# Patient Record
Sex: Male | Born: 1980
Health system: Southern US, Community
[De-identification: ages and names within clinical notes are randomized; demographics above are authoritative.]

## PROBLEM LIST (undated history)

## (undated) DIAGNOSIS — M1990 Unspecified osteoarthritis, unspecified site: Secondary | ICD-10-CM

## (undated) DIAGNOSIS — G709 Myoneural disorder, unspecified: Secondary | ICD-10-CM

## (undated) HISTORY — PX: EYE SURGERY: SHX253

## (undated) HISTORY — DX: Myoneural disorder, unspecified: G70.9

## (undated) HISTORY — DX: Unspecified osteoarthritis, unspecified site: M19.90

---

## 2008-06-12 ENCOUNTER — Encounter: Payer: Self-pay | Admitting: Family Medicine

## 2008-06-12 LAB — CONVERTED CEMR LAB: Vit D, 1,25-Dihydroxy: 21 — ABNORMAL LOW (ref 30–89)

## 2009-01-04 ENCOUNTER — Encounter: Payer: Self-pay | Admitting: Family Medicine

## 2012-05-10 ENCOUNTER — Telehealth: Payer: Self-pay | Admitting: Family Medicine

## 2012-05-10 MED ORDER — ERYTHROMYCIN 5 MG/GM OP OINT: TOPICAL_OINTMENT | Freq: Four times a day (QID) | OPHTHALMIC | Status: DC

## 2012-05-10 NOTE — Telephone Encounter (Signed)
Courtesy visit.  Wears contacts.  New pair this AM.  No eye trauma but now with L eye pain/irritation/redness.  Vision at baseline.  Has glasses to wear.    No meds, NKDA  nad ncat PERRL, R conjunctiva wnl L conjunctiva diffusely injected No FB seen.  Pain relieved with tetracaine x1 drop Stained and checked with wood's lamp, 2 very small areas seen with abnormal uptake- 1 a 6 oclock at the limbus and 1 slightly inferior and medial.    Tolerated well  AP: corneal abrasion.  Not high risk.  Vision 20/20 B.  Ery ointment and f/u prn.  Avoid contacts for at least 10 days.

## 2013-09-26 ENCOUNTER — Telehealth: Payer: Self-pay | Admitting: Family Medicine

## 2013-09-26 MED ORDER — CIPROFLOXACIN HCL 500 MG PO TABS: 500.0000 mg | ORAL_TABLET | Freq: Two times a day (BID) | ORAL | Status: DC

## 2013-09-26 MED ORDER — ACETAZOLAMIDE 250 MG PO TABS
250.0000 mg | ORAL_TABLET | Freq: Two times a day (BID) | ORAL | Status: DC
Start: 2013-09-26 — End: 2015-03-25

## 2013-09-26 NOTE — Telephone Encounter (Signed)
D/w [place].  Reasonable for  cipro 500mg  bid x 3 days, 2 courses; and acetazolamide 250mg  bid x 10 days.  Sent.

## 2014-11-23 ENCOUNTER — Telehealth: Payer: Self-pay | Admitting: Family Medicine

## 2014-11-23 MED ORDER — PROPRANOLOL HCL 10 MG PO TABS: 10.0000 mg | ORAL_TABLET | Freq: Every day | ORAL | Status: DC | PRN

## 2014-11-23 NOTE — Telephone Encounter (Signed)
Discussion with patient.  Episodic situational tremor, ie at some public functions.  No tremor or sx o/w.   Feels well o/w.   138/84, HR 78  Reasonable to try low dose of prn BB.  rx sent.

## 2014-12-03 ENCOUNTER — Other Ambulatory Visit: Payer: Self-pay | Admitting: Family Medicine

## 2014-12-03 MED ORDER — AMOXICILLIN 875 MG PO TABS: 875.0000 mg | ORAL_TABLET | Freq: Two times a day (BID) | ORAL | Status: AC

## 2014-12-03 NOTE — Progress Notes (Signed)
ST.  RST positive.  Sent rx for amoxil to [facility].  Pt agrees.

## 2015-03-25 ENCOUNTER — Telehealth: Payer: Self-pay | Admitting: Family Medicine

## 2015-03-25 MED ORDER — AMOXICILLIN 875 MG PO TABS
875.0000 mg | ORAL_TABLET | Freq: Two times a day (BID) | ORAL | Status: AC
Start: 2015-03-25 — End: 2015-04-04

## 2015-03-25 NOTE — Telephone Encounter (Signed)
Patient reports ST, absence of cough, and feeling feverish.  RST pos at clinic.   rx sent.

## 2015-06-19 DIAGNOSIS — H52223 Regular astigmatism, bilateral: Secondary | ICD-10-CM | POA: Diagnosis not present

## 2015-06-19 DIAGNOSIS — H5213 Myopia, bilateral: Secondary | ICD-10-CM | POA: Diagnosis not present

## 2017-12-05 ENCOUNTER — Encounter: Payer: Self-pay | Admitting: Nurse Practitioner

## 2017-12-05 ENCOUNTER — Ambulatory Visit (INDEPENDENT_AMBULATORY_CARE_PROVIDER_SITE_OTHER): Payer: Self-pay | Admitting: Nurse Practitioner

## 2017-12-05 VITALS — BP 130/82 | HR 92 | Temp 98.2°F | Ht 75.0 in | Wt 193.2 lb

## 2017-12-05 DIAGNOSIS — Z0000 Encounter for general adult medical examination without abnormal findings: Secondary | ICD-10-CM

## 2017-12-05 NOTE — Patient Instructions (Signed)
Health Maintenance, Male A healthy lifestyle and preventive care is important for your health and wellness. Ask your health care provider about what schedule of regular examinations is right for you. What should I know about weight and diet? Eat a Healthy Diet  Eat plenty of vegetables, fruits, whole grains, low-fat dairy products, and lean protein.  Do not eat a lot of foods high in solid fats, added sugars, or salt.  Maintain a Healthy Weight Regular exercise can help you achieve or maintain a healthy weight. You should:  Do at least 150 minutes of exercise each week. The exercise should increase your heart rate and make you sweat (moderate-intensity exercise).  Do strength-training exercises at least twice a week.  Watch Your Levels of Cholesterol and Blood Lipids  Have your blood tested for lipids and cholesterol every 5 years starting at 37 years of age. If you are at high risk for heart disease, you should start having your blood tested when you are 37 years old. You may need to have your cholesterol levels checked more often if: ? Your lipid or cholesterol levels are high. ? You are older than 37 years of age. ? You are at high risk for heart disease.  What should I know about cancer screening? Many types of cancers can be detected early and may often be prevented. Lung Cancer  You should be screened every year for lung cancer if: ? You are a current smoker who has smoked for at least 30 years. ? You are a former smoker who has quit within the past 15 years.  Talk to your health care provider about your screening options, when you should start screening, and how often you should be screened.  Colorectal Cancer  Routine colorectal cancer screening usually begins at 37 years of age and should be repeated every 5-10 years until you are 37 years old. You may need to be screened more often if early forms of precancerous polyps or small growths are found. Your health care provider  may recommend screening at an earlier age if you have risk factors for colon cancer.  Your health care provider may recommend using home test kits to check for hidden blood in the stool.  A small camera at the end of a tube can be used to examine your colon (sigmoidoscopy or colonoscopy). This checks for the earliest forms of colorectal cancer.  Prostate and Testicular Cancer  Depending on your age and overall health, your health care provider may do certain tests to screen for prostate and testicular cancer.  Talk to your health care provider about any symptoms or concerns you have about testicular or prostate cancer.  Skin Cancer  Check your skin from head to toe regularly.  Tell your health care provider about any new moles or changes in moles, especially if: ? There is a change in a mole's size, shape, or color. ? You have a mole that is larger than a pencil eraser.  Always use sunscreen. Apply sunscreen liberally and repeat throughout the day.  Protect yourself by wearing long sleeves, pants, a wide-brimmed hat, and sunglasses when outside.  What should I know about heart disease, diabetes, and high blood pressure?  If you are 89-13 years of age, have your blood pressure checked every 3-5 years. If you are 70 years of age or older, have your blood pressure checked every year. You should have your blood pressure measured twice-once when you are at a hospital or clinic, and once when  you are not at a hospital or clinic. Record the average of the two measurements. To check your blood pressure when you are not at a hospital or clinic, you can use: ? An automated blood pressure machine at a pharmacy. ? A home blood pressure monitor.  Talk to your health care provider about your target blood pressure.  If you are between 35-28 years old, ask your health care provider if you should take aspirin to prevent heart disease.  Have regular diabetes screenings by checking your fasting blood  sugar level. ? If you are at a normal weight and have a low risk for diabetes, have this test once every three years after the age of 64. ? If you are overweight and have a high risk for diabetes, consider being tested at a younger age or more often.  A one-time screening for abdominal aortic aneurysm (AAA) by ultrasound is recommended for men aged 71-75 years who are current or former smokers. What should I know about preventing infection? Hepatitis B If you have a higher risk for hepatitis B, you should be screened for this virus. Talk with your health care provider to find out if you are at risk for hepatitis B infection. Hepatitis C Blood testing is recommended for:  Everyone born from [birth year] through 1965.  Anyone with known risk factors for hepatitis C.  Sexually Transmitted Diseases (STDs)  You should be screened each year for STDs including gonorrhea and chlamydia if: ? You are sexually active and are younger than 37 years of age. ? You are older than 37 years of age and your health care provider tells you that you are at risk for this type of infection. ? Your sexual activity has changed since you were last screened and you are at an increased risk for chlamydia or gonorrhea. Ask your health care provider if you are at risk.  Talk with your health care provider about whether you are at high risk of being infected with HIV. Your health care provider may recommend a prescription medicine to help prevent HIV infection.  What else can I do?  Schedule regular health, dental, and eye exams.  Stay current with your vaccines (immunizations).  Do not use any tobacco products, such as cigarettes, chewing tobacco, and e-cigarettes. If you need help quitting, ask your health care provider.  Limit alcohol intake to no more than 2 drinks per day. One drink equals 12 ounces of beer, 5 ounces of wine, or 1 ounces of hard liquor.  Do not use street drugs.  Do not share needles.  Ask your  health care provider for help if you need support or information about quitting drugs.  Tell your health care provider if you often feel depressed.  Tell your health care provider if you have ever been abused or do not feel safe at home. This information is not intended to replace advice given to you by your health care provider. Make sure you discuss any questions you have with your health care provider. Document Released: 11/28/2007 Document Revised: 01/29/2016 Document Reviewed: 03/05/2015 Elsevier Interactive Patient Education  2018 [place] 18-39 Years, Male Preventive care refers to lifestyle choices and visits with your health care provider that can promote health and wellness. What does preventive care include?  A yearly physical exam. This is also called an annual well check.  Dental exams once or twice a year.  Routine eye exams. Ask your health care provider how often you should have  your eyes checked.  Personal lifestyle choices, including: ? Daily care of your teeth and gums. ? Regular physical activity. ? Eating a healthy diet. ? Avoiding tobacco and drug use. ? Limiting alcohol use. ? Practicing safe sex. What happens during an annual well check? The services and screenings done by your health care provider during your annual well check will depend on your age, overall health, lifestyle risk factors, and family history of disease. Counseling Your health care provider may ask you questions about your:  Alcohol use.  Tobacco use.  Drug use.  Emotional well-being.  Home and relationship well-being.  Sexual activity.  Eating habits.  Work and work Statistician.  Screening You may have the following tests or measurements:  Height, weight, and BMI.  Blood pressure.  Lipid and cholesterol levels. These may be checked every 5 years starting at age 11.  Diabetes screening. This is done by checking your blood sugar (glucose) after you  have not eaten for a while (fasting).  Skin check.  Hepatitis C blood test.  Hepatitis B blood test.  Sexually transmitted disease (STD) testing.  Discuss your test results, treatment options, and if necessary, the need for more tests with your health care provider. Vaccines Your health care provider may recommend certain vaccines, such as:  Influenza vaccine. This is recommended every year.  Tetanus, diphtheria, and acellular pertussis (Tdap, Td) vaccine. You may need a Td booster every 10 years.  Varicella vaccine. You may need this if you have not been vaccinated.  HPV vaccine. If you are 79 or younger, you may need three doses over 6 months.  Measles, mumps, and rubella (MMR) vaccine. You may need at least one dose of MMR.You may also need a second dose.  Pneumococcal 13-valent conjugate (PCV13) vaccine. You may need this if you have certain conditions and have not been vaccinated.  Pneumococcal polysaccharide (PPSV23) vaccine. You may need one or two doses if you smoke cigarettes or if you have certain conditions.  Meningococcal vaccine. One dose is recommended if you are age 77-21 years and a first-year college student living in a residence hall, or if you have one of several medical conditions. You may also need additional booster doses.  Hepatitis A vaccine. You may need this if you have certain conditions or if you travel or work in places where you may be exposed to hepatitis A.  Hepatitis B vaccine. You may need this if you have certain conditions or if you travel or work in places where you may be exposed to hepatitis B.  Haemophilus influenzae type b (Hib) vaccine. You may need this if you have certain risk factors.  Talk to your health care provider about which screenings and vaccines you need and how often you need them. This information is not intended to replace advice given to you by your health care provider. Make sure you discuss any questions you have with  your health care provider. Document Released: 07/28/2001 Document Revised: 02/19/2016 Document Reviewed: 04/02/2015 Elsevier Interactive Patient Education  2018 [employer].  Preventing Hypertension Hypertension, commonly called high blood pressure, is when the force of blood pumping through the arteries is too strong. Arteries are blood vessels that carry blood from the heart throughout the body. Over time, hypertension can damage the arteries and decrease blood flow to important parts of the body, including the brain, heart, and kidneys. Often, hypertension does not cause symptoms until blood pressure is very high. For this reason, it is important to have  your blood pressure checked on a regular basis. Hypertension can often be prevented with diet and lifestyle changes. If you already have hypertension, you can control it with diet and lifestyle changes, as well as medicine. What nutrition changes can be made? Maintain a healthy diet. This includes:  Eating less salt (sodium). Ask your health care provider how much sodium is safe for you to have. The general recommendation is to consume less than 1 tsp (2,300 mg) of sodium a day. ? Do not add salt to your food. ? Choose low-sodium options when grocery shopping and eating out.  Limiting fats in your diet. You can do this by eating low-fat or fat-free dairy products and by eating less red meat.  Eating more fruits, vegetables, and whole grains. Make a goal to eat: ? 1-2 cups of fresh fruits and vegetables each day. ? 3-4 servings of whole grains each day.  Avoiding foods and beverages that have added sugars.  Eating fish that contain healthy fats (omega-3 fatty acids), such as mackerel or salmon.  If you need help putting together a healthy eating plan, try the DASH diet. This diet is high in fruits, vegetables, and whole grains. It is low in sodium, red meat, and added sugars. DASH stands for Dietary Approaches to Stop Hypertension. What  lifestyle changes can be made?  Lose weight if you are overweight. Losing just 3?5% of your body weight can help prevent or control hypertension. ? For example, if your present weight is 200 lb (91 kg), a loss of 3-5% of your weight means losing 6-10 lb (2.7-4.5 kg). ? Ask your health care provider to help you with a diet and exercise plan to safely lose weight.  Get enough exercise. Do at least 150 minutes of moderate-intensity exercise each week. ? You could do this in short exercise sessions several times a day, or you could do longer exercise sessions a few times a week. For example, you could take a brisk 10-minute walk or bike ride, 3 times a day, for 5 days a week.  Find ways to reduce stress, such as exercising, meditating, listening to music, or taking a yoga class. If you need help reducing stress, ask your health care provider.  Do not smoke. This includes e-cigarettes. Chemicals in tobacco and nicotine products raise your blood pressure each time you smoke. If you need help quitting, ask your health care provider.  Avoid alcohol. If you drink alcohol, limit alcohol intake to no more than 1 drink a day for nonpregnant women and 2 drinks a day for men. One drink equals 12 oz of beer, 5 oz of wine, or 1 oz of hard liquor. Why are these changes important? Diet and lifestyle changes can help you prevent hypertension, and they may make you feel better overall and improve your quality of life. If you have hypertension, making these changes will help you control it and help prevent major complications, such as:  Hardening and narrowing of arteries that supply blood to: ? Your heart. This can cause a heart attack. ? Your brain. This can cause a stroke. ? Your kidneys. This can cause kidney failure.  Stress on your heart muscle, which can cause heart failure.  What can I do to lower my risk?  Work with your health care provider to make a hypertension prevention plan that works for you.  Follow your plan and keep all follow-up visits as told by your health care provider.  Learn how to check your blood  pressure at home. Make sure that you know your personal target blood pressure, as told by your health care provider. How is this treated? In addition to diet and lifestyle changes, your health care provider may recommend medicines to help lower your blood pressure. You may need to try a few different medicines to find what works best for you. You also may need to take more than one medicine. Take over-the-counter and prescription medicines only as told by your health care provider. Where to find support: Your health care provider can help you prevent hypertension and help you keep your blood pressure at a healthy level. Your local hospital or your community may also provide support services and prevention programs. The American Heart Association offers an online support network at: CheapBootlegs.com.cy Where to find more information: Learn more about hypertension from:  National Heart, Lung, and Blood Institute: ElectronicHangman.is  Centers for Disease Control and Prevention: https://ingram.com/  American Academy of Family Physicians: http://familydoctor.org/familydoctor/en/diseases-conditions/high-blood-pressure.printerview.all.html  Learn more about the DASH diet from:  [place], Lung, and [place]: https://www.reyes.com/  Contact a health care provider if:  You think you are having a reaction to medicines you have taken.  You have recurrent headaches or feel dizzy.  You have swelling in your ankles.  You have trouble with your vision. Summary  Hypertension often does not cause any symptoms until blood pressure is very high. It is important to get your blood pressure checked regularly.  Diet and lifestyle changes are the most important steps in preventing  hypertension.  By keeping your blood pressure in a healthy range, you can prevent complications like heart attack, heart failure, stroke, and kidney failure.  Work with your health care provider to make a hypertension prevention plan that works for you. This information is not intended to replace advice given to you by your health care provider. Make sure you discuss any questions you have with your health care provider. Document Released: 06/16/2015 Document Revised: 02/10/2016 Document Reviewed: 02/10/2016 Elsevier Interactive Patient Education  [employer].

## 2017-12-05 NOTE — Progress Notes (Signed)
Subjective:  Derrick Williams is a 37 y.o. male who presents for basic physical exam. The patient needs this assessment, as this is a requirement for [place] to keep his insurance premiums low.  Patient denies any current health related concerns today.  The patient denies any past medical history of heart disease, lung disease, kidney disease, liver disease, DM, asthma, HTN or seizures.  The patient does not take any medications on a daily basis, has no allergies to medications.  Immunizations are up to date.   Patient has a surgical history of lasik eye surgery bilaterally in 2017.  The patient is not married and has no children.  The patient has a family history of migraines and HTN on his mother's side and HTN on his father's side.  Both parents are still living.  The patient has 2 brothers, who are health.  The patient denies the use of recreational drugs, does not smoke, but does drink socially.   Immunization History  Administered Date(s) Administered  . Influenza Split 04/06/2012    Social History   Tobacco Use  . Smoking status: Not on file  Substance Use Topics  . Alcohol use: Not on file  . Drug use: Not on file    No Known Allergies  Current Outpatient Medications  Medication Sig Dispense Refill  . propranolol (INDERAL) 10 MG tablet Take 1-2 tablets (10-20 mg total) by mouth daily as needed (for tremor). 60 tablet 1   No current facility-administered medications for this visit.     Review of Systems  Constitutional: Negative.   HENT: Negative.   Eyes: Negative.   Respiratory: Negative.   Cardiovascular: Negative.   Gastrointestinal: Negative.   Genitourinary: Negative.   Musculoskeletal: Negative.   Skin: Negative.   Neurological: Negative.   Endo/Heme/Allergies: Negative.   Psychiatric/Behavioral: Negative.     Objective:  BP 130/82   Pulse 92   Temp 98.2 F (36.8 C)   Ht 6\' 3"  (1.905 m)   Wt 193 lb 3.2 oz (87.6 kg)   SpO2 97%   BMI 24.15 kg/m    General Appearance:  Alert, cooperative, no distress, appears stated age  Head:  Normocephalic, without obvious abnormality, atraumatic  Eyes:  PERRL, conjunctiva/corneas clear, EOM's intact, fundi benign, both eyes  Ears:  Normal TM's and external ear canals, both ears  Nose: Nares normal, septum midline, mucosa normal, no drainage or sinus tenderness  Throat: Lips, mucosa, and tongue normal; teeth and gums normal  Neck: Supple, symmetrical, trachea midline, no adenopathy, thyroid: not enlarged, symmetric, no tenderness/mass/nodules, no carotid bruit or JVD  Back:   Symmetric, no curvature, ROM normal, no CVA tenderness  Lungs:   Clear to auscultation bilaterally, respirations unlabored  Chest Wall:  No tenderness or deformity  Heart:  Regular rate and rhythm, S1, S2 normal, no murmur, rub or gallop  Abdomen:   Soft, non-tender, bowel sounds active all four quadrants,  no masses, no organomegaly  Genitalia:  Deferred  Rectal:  Deferred  Extremities: Extremities normal, atraumatic, no cyanosis or edema  Pulses: 2+ and symmetric  Skin: Skin color, texture, turgor normal, no rashes or lesions  Lymph nodes: Cervical, supraclavicular nodes normal  Neurologic: Normal    Assessment:  basic physical exam    Plan:  Patient education provided. The patient provided a copy of his most recent labs.  All labs were withing normal limits.  Patient instructed he is able to follow up with PCP for any further diagnostic screening test. Discussed patient's  blood pressure with him and his family history of same.  Patient understands ways to prevent and manage hypertension.  The patient was given education on health maintenance and health prevention for his age group.  Patient was also given education on HTN.  The patient verbalizes understanding and had no questions at time of discharge. Patient will follow up with PCP.

## 2018-07-14 DIAGNOSIS — H5211 Myopia, right eye: Secondary | ICD-10-CM | POA: Diagnosis not present

## 2018-07-14 DIAGNOSIS — Z0100 Encounter for examination of eyes and vision without abnormal findings: Secondary | ICD-10-CM | POA: Diagnosis not present

## 2018-08-05 ENCOUNTER — Other Ambulatory Visit: Payer: Self-pay | Admitting: Family Medicine

## 2018-08-05 MED ORDER — BENZONATATE 200 MG PO CAPS: 200.0000 mg | ORAL_CAPSULE | Freq: Three times a day (TID) | ORAL | 1 refills | Status: DC | PRN

## 2018-08-05 MED ORDER — ALBUTEROL SULFATE HFA 108 (90 BASE) MCG/ACT IN AERS: 1.0000 | INHALATION_SPRAY | Freq: Four times a day (QID) | RESPIRATORY_TRACT | 0 refills | Status: DC | PRN

## 2018-08-05 NOTE — Progress Notes (Signed)
D/w pt.  Recent cough w/o fever.  Reasonable to try tessalon and SABA prn.  Update me as needed.  He agrees.  Crawford Givens

## 2018-10-17 ENCOUNTER — Other Ambulatory Visit: Payer: Self-pay

## 2018-10-17 ENCOUNTER — Encounter: Payer: Self-pay | Admitting: Family Medicine

## 2018-10-17 ENCOUNTER — Ambulatory Visit: Payer: 59 | Admitting: Family Medicine

## 2018-10-17 DIAGNOSIS — T148XXA Other injury of unspecified body region, initial encounter: Secondary | ICD-10-CM | POA: Diagnosis not present

## 2018-10-17 NOTE — Progress Notes (Signed)
Glass shattered at home about 2 weeks ago.  Then last night was walking barefoot, possible small piece of glass in R 5th toe.  No pain unless weight bearing.  No trauma o/w.  No FCNAVD.    Meds, vitals, and allergies reviewed.   ROS: Per HPI unless specifically indicated in ROS section   nad R foot with normal inspection except for small defect in the R5th toe on the plantar side.  Initially ttp.  Area cleaned with alcohol and examined under magnification.  Very small piece of glass removed with forceps and 25g needle with relief of pain.  Able to bear weight w/o sx thereafter.  No blood loss.  No complications.  Toe bandaged.  Tolerated well.

## 2018-10-17 NOTE — Assessment & Plan Note (Signed)
Doesn't appear infected.  Glass removed w/o complications.  able to bear weight w/o any pain after removal.  Was superficial.  Dressed with bandaid and neosporin.  Update me as needed.  He agreed.

## 2019-04-12 ENCOUNTER — Other Ambulatory Visit: Payer: Self-pay | Admitting: Primary Care

## 2019-04-12 ENCOUNTER — Telehealth: Payer: Self-pay | Admitting: Primary Care

## 2019-04-12 MED ORDER — ALBUTEROL SULFATE HFA 108 (90 BASE) MCG/ACT IN AERS: 1.0000 | INHALATION_SPRAY | Freq: Four times a day (QID) | RESPIRATORY_TRACT | 0 refills | Status: DC | PRN

## 2019-04-12 NOTE — Telephone Encounter (Signed)
Spoke with patient who is needing a refill of his albuterol inhaler. Patient going out of town today, refill sent to pharmacy.

## 2019-06-06 ENCOUNTER — Other Ambulatory Visit: Payer: Self-pay | Admitting: Primary Care

## 2019-09-02 ENCOUNTER — Other Ambulatory Visit: Payer: Self-pay | Admitting: Family Medicine

## 2019-09-02 MED ORDER — GABAPENTIN 100 MG PO CAPS: 200.0000 mg | ORAL_CAPSULE | Freq: Two times a day (BID) | ORAL | 3 refills | Status: DC

## 2019-09-02 MED ORDER — PREDNISONE 50 MG PO TABS: 50.0000 mg | ORAL_TABLET | Freq: Every day | ORAL | 0 refills | Status: DC

## 2019-09-02 NOTE — Progress Notes (Signed)
Phone note  24 hour of intense pain in neck and upper back. Radicular symptoms down right arm in C7-T1 mild weakness noted. Pain not controlled with ibuprofen or tylenol  Will see patient Monday sent in prednisone and gabapentin for the weekend,

## 2019-09-03 ENCOUNTER — Encounter: Payer: Self-pay | Admitting: Family Medicine

## 2019-09-03 NOTE — Progress Notes (Signed)
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  Started on Friday with severe pain 9 out of 10.  States that the pain seems to be improving but unfortunately having continued radicular symptoms and appears to have some weakness he states.  Seems to be in the right arm.  Difficulty with such things as opening a can.  Has been on prednisone and gabapentin for 1 day with some mild improvement.  Continuing Flexeril which has also been somewhat beneficial.  No true injury.  Patient has some mild increase in stress but nothing that has been too severe.  Physical exam shows that patient does have some positive Spurling's on the right side with radicular symptoms in the C7-C8 distribution.  Patient does have weakness in the C8 distribution.  Patient tricep tendon reflex does appear to be intact but possibly less than the contralateral side.  Numbness noted all the way to the axillary on the ulnar aspect of the arm.  No rashes appreciated.  Assessment and plan: Cervical radiculopathy.  Acute onset.  Prednisone and gabapentin continued.  Once patient is done with the prednisone will switch to ibuprofen.  Continue the gabapentin and may need to increase in dose.  X-rays pending of the neck and thoracic.  Worsening symptoms or no improvement in the next 7 to 10 days MRI would likely be necessary.  Patient is in agreement with the plan.

## 2019-09-04 ENCOUNTER — Ambulatory Visit (INDEPENDENT_AMBULATORY_CARE_PROVIDER_SITE_OTHER)
Admission: RE | Admit: 2019-09-04 | Discharge: 2019-09-04 | Disposition: A | Discharge status: Home / Self Care | Payer: 59 | Source: Ambulatory Visit | Attending: Family Medicine | Admitting: Family Medicine

## 2019-09-04 ENCOUNTER — Other Ambulatory Visit: Payer: Self-pay

## 2019-09-04 DIAGNOSIS — M546 Pain in thoracic spine: Secondary | ICD-10-CM | POA: Diagnosis not present

## 2019-09-04 DIAGNOSIS — M542 Cervicalgia: Secondary | ICD-10-CM

## 2019-09-13 ENCOUNTER — Other Ambulatory Visit: Payer: Self-pay

## 2019-09-13 ENCOUNTER — Ambulatory Visit: Payer: 59 | Admitting: Family Medicine

## 2019-09-13 DIAGNOSIS — M5412 Radiculopathy, cervical region: Secondary | ICD-10-CM | POA: Insufficient documentation

## 2019-09-13 MED ORDER — IBUPROFEN-FAMOTIDINE 800-26.6 MG PO TABS: 1.0000 | ORAL_TABLET | Freq: Three times a day (TID) | ORAL | 0 refills | Status: DC

## 2019-09-13 NOTE — Patient Instructions (Signed)
Good to see you Duexis 3 times a day See me again in 4-6 weeks

## 2019-09-13 NOTE — Progress Notes (Signed)
[place] Phone: [phone] Subjective:   [name], am serving as a scribe for Dr. Hulan Saas. This visit occurred during the SARS-CoV-2 public health emergency.  Safety protocols were in place, including screening questions prior to the visit, additional usage of staff PPE, and extensive cleaning of exam room while observing appropriate contact time as indicated for disinfecting solutions.    I'm seeing this patient by the request  of:  No primary care provider on file.  CC: Neck pain  SEG:[id number]   09/03/2019 Cervical radiculopathy.  Acute onset.  Prednisone and gabapentin continued.  Once patient is done with the prednisone will switch to ibuprofen.  Continue the gabapentin and may need to increase in dose.  X-rays pending of the neck and thoracic.  Worsening symptoms or no improvement in the next 7 to 10 days MRI would likely be necessary.  Patient is in agreement with the plan.  Update 09/13/2019 Derrick Williams is a 39 y.o. male coming in with complaint of neck pain. Patient states that medications are helping to reduce pain but that pain is unchanged since last visit. Continues to have right sided upper extremity radiculopathy and pain in right trapezius.       Social History   Socioeconomic History  . Marital status: Single    Spouse name: Not on file  . Number of children: Not on file  . Years of education: Not on file  . Highest education level: Not on file  Occupational History  . Not on file  Tobacco Use  . Smoking status: Never Smoker  . Smokeless tobacco: Never Used  Substance and Sexual Activity  . Alcohol use: Not on file  . Drug use: Not on file  . Sexual activity: Not on file  Other Topics Concern  . Not on file  Social History Narrative  . Not on file   Social Determinants of Health   Financial Resource Strain:   . Difficulty of Paying Living Expenses:   Food  Insecurity:   . Worried About Charity fundraiser in the Last Year:   . Arboriculturist in the Last Year:   Transportation Needs:   . Film/video editor (Medical):   Marland Kitchen Lack of Transportation (Non-Medical):   Physical Activity:   . Days of Exercise per Week:   . Minutes of Exercise per Session:   Stress:   . Feeling of Stress :   Social Connections:   . Frequency of Communication with Friends and Family:   . Frequency of Social Gatherings with Friends and Family:   . Attends Religious Services:   . Active Member of Clubs or Organizations:   . Attends Archivist Meetings:   Marland Kitchen Marital Status:    No Known Allergies No family history on file.  Current Outpatient Medications (Endocrine & Metabolic):  .  predniSONE (DELTASONE) 50 MG tablet, Take 1 tablet (50 mg total) by mouth daily.  Current Outpatient Medications (Cardiovascular):  .  propranolol (INDERAL) 10 MG tablet, Take 1-2 tablets (10-20 mg total) by mouth daily as needed (for tremor).  Current Outpatient Medications (Respiratory):  .  albuterol (VENTOLIN HFA) 108 (90 Base) MCG/ACT inhaler, Inhale 1-2 puffs into the lungs every 6 (six) hours as needed (for cough).  Current Outpatient Medications (Analgesics):  Marland Kitchen  Ibuprofen-Famotidine 800-26.6 MG TABS, Take 1 tablet by mouth in the morning, at noon, and at bedtime.   Current Outpatient  Medications (Other):  .  gabapentin (NEURONTIN) 100 MG capsule, Take 2 capsules (200 mg total) by mouth 2 (two) times daily.   Reviewed prior external information including notes and imaging from  primary care provider As well as notes that were available from care everywhere and other healthcare systems.  Past medical history, social, surgical and family history all reviewed in electronic medical record.  No pertanent information unless stated regarding to the chief complaint.   Review of Systems:  No headache, visual changes, nausea, vomiting, diarrhea, constipation,  dizziness, abdominal pain, skin rash, fevers, chills, night sweats, weight loss, swollen lymph nodes, body aches, joint swelling, chest pain, shortness of breath, mood changes. POSITIVE muscle aches  Objective  Blood pressure 122/82, pulse 75, height 6\' 3"  (1.905 m), weight 212 lb (96.2 kg), SpO2 98 %.   General: No apparent distress alert and oriented x3 mood and affect normal, dressed appropriately.  HEENT: Pupils equal, extraocular movements intact  Respiratory: Patient's speak in full sentences and does not appear short of breath  Cardiovascular: No lower extremity edema, non tender, no erythema  Neuro: Cranial nerves II through XII are intact, neurovascularly intact in all extremities with 2+ DTRs and 2+ pulses.  Gait normal with good balance and coordination.  MSK:  Non tender with full range of motion and good stability and symmetric strength and tone of shoulders, elbows, wrist, hip, knee and ankles bilaterally.  Neck: Inspection loss of lordosis. No palpable stepoffs. Negative Spurling's maneuver. Mild limitation in range of motion lacking last 5 degrees of extension.  With range of motion testing patient does state continued numbness in the C7, C8 and T1 distribution.  No weakness are noted. Grip strength and sensation normal in bilateral hands Strength good C4 to T1 distribution No sensory change to C4 to T1 Negative Hoffman sign bilaterally Reflexes normal   Impression and Recommendations:     This case required medical decision making of moderate complexity. The above documentation has been reviewed and is accurate and complete , DO       Note: This dictation was prepared with Dragon dictation along with smaller phrase technology. Any transcriptional errors that result from this process are unintentional.

## 2019-09-13 NOTE — Assessment & Plan Note (Signed)
New finding, radiation in the C8-T1 distribution, improvement in strength and DTR intact. Pain is being controlled with gabapentin and responded well to prednisone, rx of duexis given today HEP given  RTC in 4 weeks worsenign symptoms like weakness will need MRI.  Does have numbness.

## 2019-09-14 ENCOUNTER — Encounter: Payer: Self-pay | Admitting: Family Medicine

## 2019-09-19 ENCOUNTER — Other Ambulatory Visit: Payer: Self-pay | Admitting: Family Medicine

## 2019-09-19 ENCOUNTER — Other Ambulatory Visit (HOSPITAL_COMMUNITY): Payer: Self-pay | Admitting: Family Medicine

## 2019-09-19 MED ORDER — GABAPENTIN 300 MG PO CAPS: 300.0000 mg | ORAL_CAPSULE | Freq: Three times a day (TID) | ORAL | 3 refills | Status: DC

## 2019-09-26 ENCOUNTER — Other Ambulatory Visit: Payer: Self-pay

## 2019-09-26 DIAGNOSIS — M542 Cervicalgia: Secondary | ICD-10-CM

## 2019-09-30 ENCOUNTER — Other Ambulatory Visit: Payer: Self-pay

## 2019-09-30 ENCOUNTER — Ambulatory Visit (INDEPENDENT_AMBULATORY_CARE_PROVIDER_SITE_OTHER): Payer: 59

## 2019-09-30 DIAGNOSIS — M542 Cervicalgia: Secondary | ICD-10-CM

## 2019-09-30 DIAGNOSIS — M4802 Spinal stenosis, cervical region: Secondary | ICD-10-CM | POA: Diagnosis not present

## 2019-10-04 ENCOUNTER — Encounter: Payer: Self-pay | Admitting: Family Medicine

## 2019-10-04 ENCOUNTER — Other Ambulatory Visit: Payer: Self-pay

## 2019-10-04 DIAGNOSIS — M5412 Radiculopathy, cervical region: Secondary | ICD-10-CM

## 2019-10-19 ENCOUNTER — Ambulatory Visit: Payer: 59 | Admitting: Physical Therapy

## 2019-10-19 ENCOUNTER — Encounter: Payer: Self-pay | Admitting: Physical Therapy

## 2019-10-19 ENCOUNTER — Other Ambulatory Visit: Payer: Self-pay

## 2019-10-19 DIAGNOSIS — M5412 Radiculopathy, cervical region: Secondary | ICD-10-CM

## 2019-10-19 DIAGNOSIS — M542 Cervicalgia: Secondary | ICD-10-CM | POA: Diagnosis not present

## 2019-10-25 NOTE — Progress Notes (Signed)
[place] Phone: [phone] Subjective:   Derrick Williams, am serving as a scribe for Dr. Hulan Saas. This visit occurred during the SARS-CoV-2 public health emergency.  Safety protocols were in place, including screening questions prior to the visit, additional usage of staff PPE, and extensive cleaning of exam room while observing appropriate contact time as indicated for disinfecting solutions.   I'm seeing this patient by the request  of:  Williams primary care provider on file.  CC: Cervical neck pain follow-up  UJW:[id number]   09/13/2019 New finding, radiation in the C8-T1 distribution, improvement in strength and DTR intact. Pain is being controlled with gabapentin and responded well to prednisone, rx of duexis given today HEP given  RTC in 4 weeks worsenign symptoms like weakness will need MRI.  Does have numbness.   Update 10/26/2019 Derrick Williams is a 39 y.o. male coming in with complaint of neck pain. Patient states that his neck pain has improved. Using Tylenol and Aleve in the mornings for pain relief.  Patient since last exam did have a MRI showing the patient did have a T1-T2 protruding disc causing nerve impingement.  Has been doing physical therapy and is making progress.  Denies any worsening of any symptoms.  Decreasing the amount of medications he is taking regularly.  Still not back at his baseline and still has not played tennis which he does think would make things better.    Williams past medical history on file. Williams past surgical history on file. Social History   Socioeconomic History  . Marital status: Single    Spouse name: Not on file  . Number of children: Not on file  . Years of education: Not on file  . Highest education level: Not on file  Occupational History  . Not on file  Tobacco Use  . Smoking status: Never Smoker  . Smokeless tobacco: Never Used  Substance and Sexual Activity    . Alcohol use: Not on file  . Drug use: Not on file  . Sexual activity: Not on file  Other Topics Concern  . Not on file  Social History Narrative  . Not on file   Social Determinants of Health   Financial Resource Strain:   . Difficulty of Paying Living Expenses:   Food Insecurity:   . Worried About Charity fundraiser in the Last Year:   . Arboriculturist in the Last Year:   Transportation Needs:   . Film/video editor (Medical):   Marland Kitchen Lack of Transportation (Non-Medical):   Physical Activity:   . Days of Exercise per Week:   . Minutes of Exercise per Session:   Stress:   . Feeling of Stress :   Social Connections:   . Frequency of Communication with Friends and Family:   . Frequency of Social Gatherings with Friends and Family:   . Attends Religious Services:   . Active Member of Clubs or Organizations:   . Attends Archivist Meetings:   Marland Kitchen Marital Status:    Williams Known Allergies Williams family history on file.  Current Outpatient Medications (Endocrine & Metabolic):  .  predniSONE (DELTASONE) 50 MG tablet, Take 1 tablet (50 mg total) by mouth daily.  Current Outpatient Medications (Cardiovascular):  .  propranolol (INDERAL) 10 MG tablet, Take 1-2 tablets (10-20 mg total) by mouth daily as needed (for tremor).  Current Outpatient Medications (Respiratory):  .  albuterol (VENTOLIN  HFA) 108 (90 Base) MCG/ACT inhaler, Inhale 1-2 puffs into the lungs every 6 (six) hours as needed (for cough).  Current Outpatient Medications (Analgesics):  Marland Kitchen  Ibuprofen-Famotidine 800-26.6 MG TABS, Take 1 tablet by mouth in the morning, at noon, and at bedtime.   Current Outpatient Medications (Other):  .  gabapentin (NEURONTIN) 300 MG capsule, Take 1 capsule (300 mg total) by mouth 3 (three) times daily.   Reviewed prior external information including notes and imaging from  primary care provider As well as notes that were available from care everywhere and other healthcare  systems.  Past medical history, social, surgical and family history all reviewed in electronic medical record.  Williams pertanent information unless stated regarding to the chief complaint.   Review of Systems:  Williams headache, visual changes, nausea, vomiting, diarrhea, constipation, dizziness, abdominal pain, skin rash, fevers, chills, night sweats, weight loss, swollen lymph nodes, body aches, joint swelling, chest pain, shortness of breath, mood changes. POSITIVE muscle aches  Objective  There were Williams vitals taken for this visit.   General: Williams apparent distress alert and oriented x3 mood and affect normal, dressed appropriately.  HEENT: Pupils equal, extraocular movements intact  Respiratory: Patient's speak in full sentences and does not appear short of breath  Cardiovascular: Williams lower extremity edema, non tender, Williams erythema  Neuro: Cranial nerves II through XII are intact, neurovascularly intact in all extremities with 2+ DTRs and 2+ pulses.  Gait normal with good balance and coordination.  MSK:  Non tender with full range of motion and good stability and symmetric strength and tone of shoulders, elbows, wrist, hip, knee and ankles bilaterally.  Neck exam still has some mild loss of lordosis.  Patient does have some worsening discomfort it appears with some extension.  Patient has 5 out of 5 strength of the upper extremities.  Deep tendon reflexes are intact.   Impression and Recommendations:      The above documentation has been reviewed and is accurate and complete Judi Saa, DO       Note: This dictation was prepared with Dragon dictation along with smaller phrase technology. Any transcriptional errors that result from this process are unintentional.

## 2019-10-26 ENCOUNTER — Other Ambulatory Visit: Payer: Self-pay

## 2019-10-26 ENCOUNTER — Ambulatory Visit: Payer: 59 | Admitting: Family Medicine

## 2019-10-26 ENCOUNTER — Encounter: Payer: Self-pay | Admitting: Family Medicine

## 2019-10-26 DIAGNOSIS — M5412 Radiculopathy, cervical region: Secondary | ICD-10-CM

## 2019-10-26 NOTE — Assessment & Plan Note (Signed)
Patient has made some improvement.  Started on some mild muscle energy.  Continues to take ibuprofen and gabapentin fairly regularly.  Discussed the possibility of decreasing this again.  Patient will continue with the physical therapy if he thinks it is beneficial.  Likely no significant weakness and no radicular symptoms at the moment that is significant but continues to have numbness in the T1 distribution.  Follow-up again in 4 to 8 weeks

## 2019-10-28 ENCOUNTER — Encounter: Payer: Self-pay | Admitting: Physical Therapy

## 2019-10-28 NOTE — Therapy (Signed)
[place] [address] [place], [place], [address] Phone: [phone]   Fax:  [phone]  Physical Therapy Evaluation  Patient Details  Name: Derrick Williams MRN: [id number] Date of Birth: 1981-04-24 Referring Provider (PT): Charlann Boxer   Encounter Date: 10/19/2019  PT End of Session - 10/28/19 1426    Visit Number  1    Number of Visits  12    Date for PT Re-Evaluation  11/30/19    Authorization Type  [facility]    PT Start Time  0539    PT Stop Time  1555    PT Time Calculation (min)  40 min    Activity Tolerance  Patient tolerated treatment well    Behavior During Therapy  [facility] for tasks assessed/performed       History reviewed. No pertinent past medical history.  History reviewed. No pertinent surgical history.  There were no vitals filed for this visit.   Subjective Assessment - 10/28/19 1409    Subjective  Pt states increased pain a few weeks ago, no injury to report, was at work and noticed increased pain in neck and tingling into arm, R hand. Pt is R handed. 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 Pt does report improivng sympotms since onset, and decreasing radicular pain.    Patient Stated Goals  decreased pain    Currently in Pain?  Yes    Pain Score  2     Pain Location  Neck    Pain Orientation  Right;Left    Pain Descriptors / Indicators  Aching    Pain Type  Acute pain    Pain Onset  More than a month ago    Pain Frequency  Intermittent         [facility] PT Assessment - 10/28/19 0001      Assessment   Medical Diagnosis  Neck pain,     Referring Provider (PT)  Charlann Boxer    Prior Therapy  no      Balance Screen   Has the patient fallen in the past 6 months  No      Prior Function   Level of Independence  Independent      Cognition   Overall Cognitive Status  Within Functional Limits for tasks assessed      ROM / Strength   AROM / PROM / Strength  AROM;Strength      AROM   Overall AROM  Comments  shoulder: WNL, mild pain with full flexion    AROM Assessment Site  Cervical    Cervical Flexion  mild limitation    Cervical Extension  mild limitation    Cervical - Right Side Bend  mild limitation    Cervical - Left Side Bend  WFL    Cervical - Right Rotation  mild limitation    Cervical - Left Rotation  mild limiitation      Strength   Overall Strength Comments  Shoulders 4+/5;  R finger flex and ext: 5/5    Strength Assessment Site  Hand    Right/Left hand  Right;Left    Right Hand Grip (lbs)  68    Left Hand Grip (lbs)  65      Palpation   Palpation comment  Tightness and tenderness in R UT, levator, and cervical paraspinals bilateral.       Special Tests   Other special tests  Neg ULTT,  Objective measurements completed on examination: See above findings.      [facility] Adult PT Treatment/Exercise - 10/28/19 0001      Self-Care   Self-Care  Posture    Posture  Discussed optimal seated posture and posture for computer and work.       Exercises   Exercises  Neck      Neck Exercises: Seated   Neck Retraction  15 reps    Other Seated Exercise  scap retract x20;       Neck Exercises: Stretches   Upper Trapezius Stretch  3 reps;30 seconds             PT Education - 10/28/19 1426    Education Details  PT POC, Exam findings, posuture    Person(s) Educated  Patient    Methods  Explanation;Demonstration;Verbal cues;Handout    Comprehension  Verbalized understanding;Returned demonstration;Verbal cues required;Need further instruction       PT Short Term Goals - 10/28/19 1432      PT SHORT TERM GOAL #1   Title  Pt to demo improved pain in cervical region to 3/10    Time  2    Period  Weeks    Status  New    Target Date  11/02/19        PT Long Term Goals - 10/28/19 1438      PT LONG TERM GOAL #1   Title  Pt to report decreased pain to 0-2/10 with activity and work duties, with no radicular pain in R UE.    Time   6    Period  Weeks    Status  New    Target Date  11/30/19      PT LONG TERM GOAL #2   Title  Pt to demo full cervical ROM without pain, to improve ability for ADLS and driving.    Time  6    Period  Weeks    Status  New    Target Date  11/30/19      PT LONG TERM GOAL #3   Title  Pt to be independent wtih final HEP    Time  6    Period  Weeks    Status  New    Target Date  11/30/19      PT LONG TERM GOAL #4   Title  Pt to demo improved strength of postural/scapular muscles to at least 4+/5 to improve posture and pain.    Time  6    Period  Weeks    Status  New    Target Date  11/30/19             Plan - 10/28/19 1429    Clinical Impression Statement  Pt presents with primary complaint of increased pain in neck and radicular pain in R UE. Pt with some improvements of pain since onset. Pt with mild ROM limitations, and mild strength deficits. He also has mild tightness and muscle spasm in cervical and shoulder region.  He will benefit from education on posture as well as HEP. Pt with decreased abilit for full functional activites, due to pain, and will benefit from skilled PT to improve.    Examination-Activity Limitations  Reach Overhead;Bend;Sleep;Lift    Examination-Participation Restrictions  Meal Prep;Cleaning;Community Activity;Driving;Laundry;Yard Work    Stability/Clinical Decision Making  Stable/Uncomplicated    Clinical Decision Making  Low    Rehab Potential  Good    PT Frequency  2x / week    PT  Duration  6 weeks    PT Treatment/Interventions  ADLs/Self Care Home Management;Electrical Stimulation;Cryotherapy;Iontophoresis 4mg /ml Dexamethasone;Moist Heat;Traction;Neuromuscular re-education;Therapeutic exercise;Therapeutic activities;Functional mobility training;Patient/family education;Manual techniques;Vasopneumatic Device;Taping;Dry needling;Passive range of motion;Spinal Manipulations;Joint Manipulations    Consulted and Agree with Plan of Care  Patient        Patient will benefit from skilled therapeutic intervention in order to improve the following deficits and impairments:  Decreased range of motion, Increased muscle spasms, Decreased activity tolerance, Pain, Improper body mechanics, Impaired flexibility, Decreased strength  Visit Diagnosis: Radiculopathy, cervical region  Cervicalgia     Problem List Patient Active Problem List   Diagnosis Date Noted  . Cervical radiculitis 09/13/2019  . Foreign body in skin 10/17/2018    12/17/2018, PT, DPT 2:45 PM  10/28/19    [facility] [address] [place], [place], [place] Phone: [phone]   Fax:  [phone]  Name: Derrick Williams MRN: Eustaquio Boyden Date of Birth: 11-30-1980

## 2019-11-09 ENCOUNTER — Other Ambulatory Visit: Payer: Self-pay

## 2019-11-09 ENCOUNTER — Ambulatory Visit: Payer: 59 | Admitting: Physical Therapy

## 2019-11-09 DIAGNOSIS — M5412 Radiculopathy, cervical region: Secondary | ICD-10-CM | POA: Diagnosis not present

## 2019-11-09 DIAGNOSIS — M542 Cervicalgia: Secondary | ICD-10-CM | POA: Diagnosis not present

## 2019-11-16 DIAGNOSIS — H52223 Regular astigmatism, bilateral: Secondary | ICD-10-CM | POA: Diagnosis not present

## 2019-11-16 DIAGNOSIS — H524 Presbyopia: Secondary | ICD-10-CM | POA: Diagnosis not present

## 2019-11-27 ENCOUNTER — Encounter: Payer: Self-pay | Admitting: Physical Therapy

## 2019-11-27 NOTE — Therapy (Signed)
[facility] [address] [place], [place], [address] Phone: [phone]   Fax:  [phone]  Physical Therapy Treatment/Discharge   Patient Details  Name: Derrick Williams MRN: [id number] Date of Birth: 30-Oct-1980 Referring Provider (PT): Charlann Boxer   Encounter Date: 11/09/2019   PT End of Session - 11/27/19 1535    Visit Number 2    Number of Visits 12    Date for PT Re-Evaluation 11/30/19    Authorization Type [facility]    PT Start Time 1600    PT Stop Time 1640    PT Time Calculation (min) 40 min    Activity Tolerance Patient tolerated treatment well    Behavior During Therapy [facility] for tasks assessed/performed           History reviewed. No pertinent past medical history.  History reviewed. No pertinent surgical history.  There were no vitals filed for this visit.   Subjective Assessment - 11/27/19 1534    Subjective Pt states improving pain. still has mild numbness in finger.    Currently in Pain? No/denies    Pain Score 0-No pain                             [facility] Adult PT Treatment/Exercise - 11/27/19 0001      Neck Exercises: Theraband   Rows Green;20 reps    Shoulder External Rotation Red;20 reps    Horizontal ABduction Red;20 reps      Neck Exercises: Standing   Neck Retraction 10 reps      Neck Exercises: Seated   Neck Retraction 10 reps    Neck Retraction Limitations retraction with extension       Manual Therapy   Manual Therapy Joint mobilization;Soft tissue mobilization;Manual Traction;Passive ROM    Joint Mobilization PA mobs, side glides     Soft tissue mobilization DTM to bil cervical parapinals, SO, UT, SOR    Passive ROM Manual stretching for Bil UT and levator    Manual Traction 15 sec x 10;       Neck Exercises: Stretches   Upper Trapezius Stretch 3 reps;30 seconds    Other Neck Stretches Supine pec stretch 45 sec  x3  with wrist flex/ext for nerve glide     Other Neck  Stretches Standing median and ulnar nerve glides x 15 each on R;                   PT Education - 11/27/19 1535    Education Details HEP reviewed, Posture,    Person(s) Educated Patient    Methods Explanation;Demonstration;Verbal cues;Handout    Comprehension Verbalized understanding;Returned demonstration;Verbal cues required            PT Short Term Goals - 11/27/19 1536      PT SHORT TERM GOAL #1   Title Pt to demo improved pain in cervical region to 3/10    Time 2    Period Weeks    Status Achieved    Target Date 11/02/19             PT Long Term Goals - 11/27/19 1536      PT LONG TERM GOAL #1   Title Pt to report decreased pain to 0-2/10 with activity and work duties, with no radicular pain in R UE.    Time 6    Period Weeks    Status Achieved      PT  LONG TERM GOAL #2   Title Pt to demo full cervical ROM without pain, to improve ability for ADLS and driving.    Time 6    Period Weeks    Status Achieved      PT LONG TERM GOAL #3   Title Pt to be independent wtih final HEP    Time 6    Period Weeks    Status Achieved      PT LONG TERM GOAL #4   Title Pt to demo improved strength of postural/scapular muscles to at least 4+/5 to improve posture and pain.    Baseline * will continue to work on with HEP    Time 6    Period Weeks    Status Partially Met                 Plan - 11/27/19 1537    Clinical Impression Statement Pt with improving symptoms. Reviewed importance of postural exercises and HEP. Discussed traction as option for decreasing radicular symptoms/numbness. Pt states that he is pleased with current status, has not pain, just mild tingling into fingers, and requests hold of PT a this time.    Examination-Activity Limitations Reach Overhead;Bend;Sleep;Lift    Examination-Participation Restrictions Meal Prep;Cleaning;Community Activity;Driving;Laundry;Yard Work    Stability/Clinical Decision Making Stable/Uncomplicated    Rehab  Potential Good    PT Frequency 2x / week    PT Duration 6 weeks    PT Treatment/Interventions ADLs/Self Care Home Management;Electrical Stimulation;Cryotherapy;Iontophoresis 28m/ml Dexamethasone;Moist Heat;Traction;Neuromuscular re-education;Therapeutic exercise;Therapeutic activities;Functional mobility training;Patient/family education;Manual techniques;Vasopneumatic Device;Taping;Dry needling;Passive range of motion;Spinal Manipulations;Joint Manipulations    Consulted and Agree with Plan of Care Patient           Patient will benefit from skilled therapeutic intervention in order to improve the following deficits and impairments:  Decreased range of motion, Increased muscle spasms, Decreased activity tolerance, Pain, Improper body mechanics, Impaired flexibility, Decreased strength  Visit Diagnosis: Radiculopathy, cervical region  Cervicalgia     Problem List Patient Active Problem List   Diagnosis Date Noted  . Cervical radiculitis 09/13/2019  . Foreign body in skin 10/17/2018    LLyndee Hensen PT, DPT 3:45 PM  11/27/19    [place] [address]: [phone]  Fax:  [phone] Name: JGerhard RappaportMRN: [id number] of Birth: 802/05/1981  PHYSICAL THERAPY DISCHARGE SUMMARY  Visits from Start of Care: 2  Plan: Patient agrees to discharge.  Patient goals were met. Patient is being discharged due to being pleased with the current functional level.  ?????     LLyndee Hensen PT, DPT 3:45 PM  11/27/19

## 2020-02-29 DIAGNOSIS — L700 Acne vulgaris: Secondary | ICD-10-CM | POA: Diagnosis not present

## 2020-04-18 DIAGNOSIS — L700 Acne vulgaris: Secondary | ICD-10-CM | POA: Diagnosis not present

## 2020-08-09 MED FILL — GABAPENTIN 300 MG CAPSULE: 300 | 30 days supply | Qty: 90 | Fill #0

## 2020-12-19 DIAGNOSIS — H17821 Peripheral opacity of cornea, right eye: Secondary | ICD-10-CM | POA: Diagnosis not present

## 2020-12-19 DIAGNOSIS — H5211 Myopia, right eye: Secondary | ICD-10-CM | POA: Diagnosis not present

## 2020-12-19 DIAGNOSIS — Z0100 Encounter for examination of eyes and vision without abnormal findings: Secondary | ICD-10-CM | POA: Diagnosis not present

## 2021-01-02 DIAGNOSIS — L700 Acne vulgaris: Secondary | ICD-10-CM | POA: Diagnosis not present

## 2021-01-02 DIAGNOSIS — L110 Acquired keratosis follicularis: Secondary | ICD-10-CM | POA: Diagnosis not present

## 2021-03-26 NOTE — Progress Notes (Signed)
[name] [address] [place] [address] Phone: [phone] Subjective:   Derrick Williams, am serving as a scribe for Dr. Antoine Primas. This visit occurred during the SARS-CoV-2 public health emergency.  Safety protocols were in place, including screening questions prior to the visit, additional usage of staff PPE, and extensive cleaning of exam room while observing appropriate contact time as indicated for disinfecting solutions.   I'm seeing this patient by the request  of:  No primary care provider on file.  CC: episode of foot drop   [id number]  Seen May 2021 for cervical radiculitis.  Derrick Williams is a 40 y.o. male coming in with complaint of neck pain. Pain and numbness in R arm. Same as last visit.    Foot drop R>L on Saturday. Had not run in a while and ran 1 mile. No history of foot pain. No lumbar spine pain recently or leg/hip pain. Patient states started 1-2 hours after running sand lasted 4 hours. Patient was very concerne.d by next morning was completely resolved but here for further evaluation  Patient continues to have neck pain as well. Seen previously for T1-2 protrusion and nerve pain but pain resolved with PT  Continue numbness of forearm       No past medical history on file. No past surgical history on file. Social History   Socioeconomic History   Marital status: Single    Spouse name: Not on file   Number of children: Not on file   Years of education: Not on file   Highest education level: Not on file  Occupational History   Not on file  Tobacco Use   Smoking status: Never   Smokeless tobacco: Never  Substance and Sexual Activity   Alcohol use: Not on file   Drug use: Not on file   Sexual activity: Not on file  Other Topics Concern   Not on file  Social History Narrative   Not on file   Social Determinants of Health   Financial Resource Strain: Not on file  Food Insecurity: Not on file   Transportation Needs: Not on file  Physical Activity: Not on file  Stress: Not on file  Social Connections: Not on file   No Known Allergies No family history on file.  Current Outpatient Medications (Endocrine & Metabolic):    predniSONE (DELTASONE) 20 MG tablet, Take 2 tablets (40 mg total) by mouth daily with breakfast.   predniSONE (DELTASONE) 50 MG tablet, Take 1 tablet (50 mg total) by mouth daily.  Current Outpatient Medications (Cardiovascular):    propranolol (INDERAL) 10 MG tablet, Take 1-2 tablets (10-20 mg total) by mouth daily as needed (for tremor).  Current Outpatient Medications (Respiratory):    albuterol (VENTOLIN HFA) 108 (90 Base) MCG/ACT inhaler, Inhale 1-2 puffs into the lungs every 6 (six) hours as needed (for cough).  Current Outpatient Medications (Analgesics):    Ibuprofen-Famotidine 800-26.6 MG TABS, Take 1 tablet by mouth in the morning, at noon, and at bedtime.   Current Outpatient Medications (Other):    gabapentin (NEURONTIN) 100 MG capsule, Take 2 capsules (200 mg total) by mouth at bedtime.   gabapentin (NEURONTIN) 300 MG capsule, Take 1 capsule (300 mg total) by mouth 3 (three) times daily.   gabapentin (NEURONTIN) 300 MG capsule, TAKE 1 CAPSULE BY MOUTH THREE TIMES DAILY.   Reviewed prior external information including notes and imaging from  primary care provider As well as notes that were available from  care everywhere and other healthcare systems.  Past medical history, social, surgical and family history all reviewed in electronic medical record.  No pertanent information unless stated regarding to the chief complaint.   Review of Systems:  No headache, visual changes, nausea, vomiting, diarrhea, constipation, dizziness, abdominal pain, skin rash, fevers, chills, night sweats, weight loss, swollen lymph nodes, body aches, joint swelling, chest pain, shortness of breath, mood changes. POSITIVE muscle aches  Objective  Blood pressure  118/86, pulse 86, height 6\' 3"  (1.905 m), weight 211 lb (95.7 kg), SpO2 98 %.   General: No apparent distress alert and oriented x3 mood and affect normal, dressed appropriately.  HEENT: Pupils equal, extraocular movements intact  Respiratory: Patient's speak in full sentences and does not appear short of breath  Cardiovascular: No lower extremity edema, non tender, no erythema  Gait normal with good balance and coordination.  MSK:  neck exam mild TTP and with worsening discomfort with extension of the neck. Negative suprlings but numbness noted in the C8-T1 distribution on the right  No pain in the low back. LE strength 5/5 DTR 2+ of the achilles, full ROM of the ankles.    Impression and Recommendations:    The above documentation has been reviewed and is accurate and complete , DO

## 2021-03-27 ENCOUNTER — Ambulatory Visit (INDEPENDENT_AMBULATORY_CARE_PROVIDER_SITE_OTHER): Payer: 59

## 2021-03-27 ENCOUNTER — Other Ambulatory Visit: Payer: Self-pay

## 2021-03-27 ENCOUNTER — Ambulatory Visit (INDEPENDENT_AMBULATORY_CARE_PROVIDER_SITE_OTHER): Payer: 59 | Admitting: Family Medicine

## 2021-03-27 VITALS — BP 118/86 | HR 86 | Ht 75.0 in | Wt 211.0 lb

## 2021-03-27 DIAGNOSIS — M545 Low back pain, unspecified: Secondary | ICD-10-CM

## 2021-03-27 DIAGNOSIS — M21371 Foot drop, right foot: Secondary | ICD-10-CM

## 2021-03-27 DIAGNOSIS — M2550 Pain in unspecified joint: Secondary | ICD-10-CM

## 2021-03-27 DIAGNOSIS — M5412 Radiculopathy, cervical region: Secondary | ICD-10-CM

## 2021-03-27 DIAGNOSIS — M21372 Foot drop, left foot: Secondary | ICD-10-CM

## 2021-03-27 LAB — VITAMIN B12: Vitamin B-12: 491 pg/mL (ref 211–911)

## 2021-03-27 LAB — COMPREHENSIVE METABOLIC PANEL
ALT: 18 U/L (ref 0–53)
AST: 22 U/L (ref 0–37)
Albumin: 4.8 g/dL (ref 3.5–5.2)
Alkaline Phosphatase: 62 U/L (ref 39–117)
BUN: 16 mg/dL (ref 6–23)
CO2: 31 mEq/L (ref 19–32)
Calcium: 9.7 mg/dL (ref 8.4–10.5)
Chloride: 101 mEq/L (ref 96–112)
Creatinine, Ser: 0.7 mg/dL (ref 0.40–1.50)
GFR: 115.53 mL/min (ref >60.00)
Glucose, Bld: 92 mg/dL (ref 70–99)
Potassium: 4 mEq/L (ref 3.5–5.1)
Sodium: 138 mEq/L (ref 135–145)
Total Bilirubin: 0.7 mg/dL (ref 0.2–1.2)
Total Protein: 7.5 g/dL (ref 6.0–8.3)

## 2021-03-27 LAB — CBC WITH DIFFERENTIAL/PLATELET
Basophils Absolute: 0.1 10*3/uL (ref 0.0–0.1)
Basophils Relative: 0.7 % (ref 0.0–3.0)
Eosinophils Absolute: 0.1 10*3/uL (ref 0.0–0.7)
Eosinophils Relative: 1.8 % (ref 0.0–5.0)
HCT: 46.9 % (ref 39.0–52.0)
Hemoglobin: 15.9 g/dL (ref 13.0–17.0)
Lymphocytes Relative: 22.2 % (ref 12.0–46.0)
Lymphs Abs: 1.8 10*3/uL (ref 0.7–4.0)
MCHC: 33.9 g/dL (ref 30.0–36.0)
MCV: 92.5 fl (ref 78.0–100.0)
Monocytes Absolute: 0.7 10*3/uL (ref 0.1–1.0)
Monocytes Relative: 9.4 % (ref 3.0–12.0)
Neutro Abs: 5.3 10*3/uL (ref 1.4–7.7)
Neutrophils Relative %: 65.9 % (ref 43.0–77.0)
Platelets: 202 10*3/uL (ref 150.0–400.0)
RBC: 5.08 Mil/uL (ref 4.22–5.81)
RDW: 13 % (ref 11.5–15.5)
WBC: 8 10*3/uL (ref 4.0–10.5)

## 2021-03-27 LAB — TSH: TSH: 0.72 u[IU]/mL (ref 0.35–5.50)

## 2021-03-27 LAB — IBC PANEL
Iron: 84 ug/dL (ref 42–165)
Saturation Ratios: 21.4 % (ref 20.0–50.0)
TIBC: 392 ug/dL (ref 250.0–450.0)
Transferrin: 280 mg/dL (ref 212.0–360.0)

## 2021-03-27 LAB — VITAMIN D 25 HYDROXY (VIT D DEFICIENCY, FRACTURES): VITD: 21.43 ng/mL — ABNORMAL LOW (ref 30.00–100.00)

## 2021-03-27 LAB — SEDIMENTATION RATE: Sed Rate: 5 mm/hr (ref 0–15)

## 2021-03-27 LAB — T3, FREE: T3, Free: 3.6 pg/mL (ref 2.3–4.2)

## 2021-03-27 LAB — T4, FREE: Free T4: 0.87 ng/dL (ref 0.60–1.60)

## 2021-03-27 LAB — FERRITIN: Ferritin: 78.6 ng/mL (ref 22.0–322.0)

## 2021-03-27 MED ORDER — GABAPENTIN 100 MG PO CAPS: 200.0000 mg | ORAL_CAPSULE | Freq: Every day | ORAL | 0 refills | Status: DC

## 2021-03-27 MED ORDER — PREDNISONE 20 MG PO TABS: 40.0000 mg | ORAL_TABLET | Freq: Every day | ORAL | 0 refills | Status: DC

## 2021-03-27 NOTE — Assessment & Plan Note (Signed)
Patient did have the episode of bilateral foot drop after running.  Patient was able to run initially and was fine until 2 hours afterwards.  Could be secondary to compression with potential sural nerve disruption.  Discussed with patient about monitoring.  Did last approximately 3 hours.  Differential is quite broad and the patient does have the cervical radiculopathy and we will need to monitor.  Patient knows if worsening symptoms we will discuss further.  X-rays of the back as well as laboratory work-up is in process.  Depending on findings we will discuss different treatment options.  If continuing to have difficulty or no findings we may need to consider the possibility of advanced imaging to rule out such things as multiple sclerosis.

## 2021-03-27 NOTE — Assessment & Plan Note (Signed)
Patient has had cervical radiculitis and is having worsening pain again.  This patient previously did have a T1-T2 difficulty.  Patient did respond to the formal physical therapy previously but has never regained the strength.  We discussed restarting the gabapentin and will start at 200 mg.  Patient has had 300 mg previously.  We will see how patient responds.  Given prednisone and will do 40 mg daily for 5 days to clinic, down as well.  With patient also having bilateral foot drop would like to get laboratory work-up as well as a lumbar x-ray to further evaluate why this did occur.  Discussed with him I would like him to try to manage it running 1 to do rule out possible central cord dysfunction this that could be contributing.  Patient denies any headaches, weight loss, no family history of multiple sclerosis.  Patient did not fully have the foot drop resulting hours.  Patient is in agreement with plan and will follow up with me again 4 weeks

## 2021-03-27 NOTE — Patient Instructions (Addendum)
Calf stretches before running Lumbar xray Labs today Gabapentin 200mg  at night Prednisone 40mg  for next 5 days Send message next week to tell me how you are doing Follow up in 4 weeks if needed

## 2021-03-28 LAB — PTH, INTACT AND CALCIUM
Calcium: 9.7 mg/dL (ref 8.6–10.3)
PTH: 19 pg/mL (ref 16–77)

## 2021-04-23 NOTE — Progress Notes (Signed)
[name] [address] [place] [address] Phone: [phone] Subjective:   Derrick Williams, am serving as a scribe for Dr. Antoine Primas. This visit occurred during the SARS-CoV-2 public health emergency.  Safety protocols were in place, including screening questions prior to the visit, additional usage of staff PPE, and extensive cleaning of exam room while observing appropriate contact time as indicated for disinfecting solutions.   CC: Neck pain and right elbow pain  [id number]  03/27/2021 Patient did have the episode of bilateral foot drop after running.  Patient was able to run initially and was fine until 2 hours afterwards.  Could be secondary to compression with potential sural nerve disruption.  Discussed with patient about monitoring.  Did last approximately 3 hours.  Differential is quite broad and the patient does have the cervical radiculopathy and we will need to monitor.  Patient knows if worsening symptoms we will discuss further.  X-rays of the back as well as laboratory work-up is in process.  Depending on findings we will discuss different treatment options.  If continuing to have difficulty or no findings we may need to consider the possibility of advanced imaging to rule out such things as multiple sclerosis.  Patient has had cervical radiculitis and is having worsening pain again.  This patient previously did have a T1-T2 difficulty.  Patient did respond to the formal physical therapy previously but has never regained the strength.  We discussed restarting the gabapentin and will start at 200 mg.  Patient has had 300 mg previously.  We will see how patient responds.  Given prednisone and will do 40 mg daily for 5 days to clinic, down as well.  With patient also having bilateral foot drop would like to get laboratory work-up as well as a lumbar x-ray to further evaluate why this did occur.  Discussed with him I would like him to try to  manage it running 1 to do rule out possible central cord dysfunction this that could be contributing.  Patient denies any headaches, weight loss, no family history of multiple sclerosis.  Patient did not fully have the foot drop resulting hours.  Patient is in agreement with plan and will follow up with me again 4 weeks  Updated 04/24/2021 Derrick Williams is a 40 y.o. male coming in with complaint of LBP. Has not had drop foot since last visit.   Cervical spine pain is unchanged.   Patient having R elbow pain over lateral aspect. Does play tennis. No change in grip recently. Using tennis elbow strap, Voltaren gel and NSAID for pain relief.  Patient continues to have some difficulty more on the right elbow at this moment though.  Does not remember any injury and has not been playing any tennis recently.  Xray IMPRESSION: Straightening of normal lordosis, can be seen with muscle spasm. Otherwise negative radiographs of the lumbar spine.      No past medical history on file. No past surgical history on file. Social History   Socioeconomic History   Marital status: Single    Spouse name: Not on file   Number of children: Not on file   Years of education: Not on file   Highest education level: Not on file  Occupational History   Not on file  Tobacco Use   Smoking status: Never   Smokeless tobacco: Never  Substance and Sexual Activity   Alcohol use: Not on file   Drug use: Not on file  Sexual activity: Not on file  Other Topics Concern   Not on file  Social History Narrative   Not on file   Social Determinants of Health   Financial Resource Strain: Not on file  Food Insecurity: Not on file  Transportation Needs: Not on file  Physical Activity: Not on file  Stress: Not on file  Social Connections: Not on file   No Known Allergies No family history on file.  Current Outpatient Medications (Endocrine & Metabolic):    predniSONE (DELTASONE) 20 MG tablet, Take 2 tablets  (40 mg total) by mouth daily with breakfast.   predniSONE (DELTASONE) 50 MG tablet, Take 1 tablet (50 mg total) by mouth daily.  Current Outpatient Medications (Cardiovascular):    nitroGLYCERIN (NITRO-DUR) 0.2 mg/hr patch, Apply 1/4 of a patch to skin once daily.   propranolol (INDERAL) 10 MG tablet, Take 1-2 tablets (10-20 mg total) by mouth daily as needed (for tremor).  Current Outpatient Medications (Respiratory):    albuterol (VENTOLIN HFA) 108 (90 Base) MCG/ACT inhaler, Inhale 1-2 puffs into the lungs every 6 (six) hours as needed (for cough).  Current Outpatient Medications (Analgesics):    Ibuprofen-Famotidine 800-26.6 MG TABS, Take 1 tablet by mouth in the morning, at noon, and at bedtime.   Current Outpatient Medications (Other):    gabapentin (NEURONTIN) 300 MG capsule, Take 1 capsule (300 mg total) by mouth at bedtime.   Reviewed prior external information including notes and imaging from  primary care provider As well as notes that were available from care everywhere and other healthcare systems.  Past medical history, social, surgical and family history all reviewed in electronic medical record.  No pertanent information unless stated regarding to the chief complaint.   Review of Systems:  No headache, visual changes, nausea, vomiting, diarrhea, constipation, dizziness, abdominal pain, skin rash, fevers, chills, night sweats, weight loss, swollen lymph nodes, body aches, joint swelling, chest pain, shortness of breath, mood changes. POSITIVE muscle aches in the right arm   Objective  Blood pressure 124/84, pulse 85, height 6\' 3"  (1.905 m), weight 208 lb (94.3 kg), SpO2 98 %.   General: No apparent distress alert and oriented x3 mood and affect normal, dressed appropriately.  HEENT: Pupils equal, extraocular movements intact  Respiratory: Patient's speak in full sentences and does not appear short of breath  Cardiovascular: No lower extremity edema, non tender, no  erythema  Gait normal with good balance and coordination.  MSK: Right elbow exam shows the patient does have tenderness to palpation over the lateral epicondylar region.  Mild pain over the radial nerve as well.  Good grip strength noted.  Patient noted does still have some tenderness noted in the paraspinal musculature of the cervical spine. Continued numbness in the T1 distribution.  Limited muscular skeletal ultrasound was performed and interpreted by , M  Limited musculoskeletal ultrasound shows the patient does have hypoechoic changes with a potential partial retraction noted of the common extensor tendon noted.  Neovascularization within the tendon sheath corresponding to a tear within the tendon as well. Impression: Common extensor tendon tear   Impression and Recommendations:     The above documentation has been reviewed and is accurate and complete Antoine Primas, DO

## 2021-04-24 ENCOUNTER — Encounter: Payer: Self-pay | Admitting: Family Medicine

## 2021-04-24 ENCOUNTER — Ambulatory Visit: Payer: 59 | Admitting: Family Medicine

## 2021-04-24 ENCOUNTER — Ambulatory Visit: Payer: Self-pay

## 2021-04-24 ENCOUNTER — Other Ambulatory Visit: Payer: Self-pay

## 2021-04-24 VITALS — BP 124/84 | HR 85 | Ht 75.0 in | Wt 208.0 lb

## 2021-04-24 DIAGNOSIS — M25521 Pain in right elbow: Secondary | ICD-10-CM

## 2021-04-24 DIAGNOSIS — S56519A Strain of other extensor muscle, fascia and tendon at forearm level, unspecified arm, initial encounter: Secondary | ICD-10-CM | POA: Insufficient documentation

## 2021-04-24 DIAGNOSIS — M5412 Radiculopathy, cervical region: Secondary | ICD-10-CM | POA: Diagnosis not present

## 2021-04-24 DIAGNOSIS — M7711 Lateral epicondylitis, right elbow: Secondary | ICD-10-CM | POA: Diagnosis not present

## 2021-04-24 MED ORDER — GABAPENTIN 300 MG PO CAPS: 300.0000 mg | ORAL_CAPSULE | Freq: Every day | ORAL | 0 refills | Status: DC

## 2021-04-24 MED ORDER — NITROGLYCERIN 0.2 MG/HR TD PT24: MEDICATED_PATCH | TRANSDERMAL | 0 refills | Status: DC

## 2021-04-24 NOTE — Assessment & Plan Note (Signed)
Patient is some cervical radiculitis.  Patient will increase his gabapentin to 300 mg.  See how patient responds.  Concerned that certainly could be more muscular in nature as well.  Discussed icing regimen and home exercises.  If worsening symptoms may need to consider another advanced imaging but hopefully not necessary.

## 2021-04-24 NOTE — Patient Instructions (Signed)
Nitroglycerin Protocol   Apply 1/4 nitroglycerin patch to affected area daily.  Change position of patch within the affected area every 24 hours.  You may experience a headache during the first 1-2 weeks of using the patch, these should subside.  If you experience headaches after beginning nitroglycerin patch treatment, you may take your preferred over the counter pain reliever.  Another side effect of the nitroglycerin patch is skin irritation or rash related to patch adhesive.  Please notify our office if you develop more severe headaches or rash, and stop the patch.  Tendon healing with nitroglycerin patch may require 12 to 24 weeks depending on the extent of injury.  Men should not use if taking Viagra, Cialis, or Levitra.   Do not use if you have migraines or rosacea.  Increase gabapentin 300mg  at night See me again in 5-6 weeks

## 2021-04-24 NOTE — Assessment & Plan Note (Signed)
Partial tear noted.  Increasing in Doppler flow noted in neovascularization.  Start on nitroglycerin patches.  Warned of potential side effects.  Increase activity slowly.  Differential includes that this could be secondary to weakness from the cervical radiculopathy and we will monitor as well.  Follow-up again in 3 to 4 weeks

## 2021-05-01 DIAGNOSIS — L700 Acne vulgaris: Secondary | ICD-10-CM | POA: Diagnosis not present

## 2021-05-01 DIAGNOSIS — L648 Other androgenic alopecia: Secondary | ICD-10-CM | POA: Diagnosis not present

## 2021-05-01 DIAGNOSIS — L858 Other specified epidermal thickening: Secondary | ICD-10-CM | POA: Diagnosis not present

## 2021-05-01 DIAGNOSIS — L738 Other specified follicular disorders: Secondary | ICD-10-CM | POA: Diagnosis not present

## 2021-05-28 NOTE — Progress Notes (Signed)
[name] [address] [place] [address] Phone: [phone] Subjective:   [name], am serving as a scribe for Dr. Antoine Primas. This visit occurred during the SARS-CoV-2 public health emergency.  Safety protocols were in place, including screening questions prior to the visit, additional usage of staff PPE, and extensive cleaning of exam room while observing appropriate contact time as indicated for disinfecting solutions.   I'm seeing this patient by the request  of:  Pcp, No  CC: Right elbow pain  [id number]  04/24/2021 Partial tear noted.  Increasing in Doppler flow noted in neovascularization.  Start on nitroglycerin patches.  Warned of potential side effects.  Increase activity slowly.  Differential includes that this could be secondary to weakness from the cervical radiculopathy and we will monitor as well.  Follow-up again in 3 to 4 weeks  Updated 05/29/2021 Derrick Williams is a 40 y.o. male coming in with complaint of elbow pain. Pain remains the same. Feels better when wearing wrist brace. Pain maybe a little worse. Patient does not change anything specifically.  Continues the nitroglycerin with no significant side effects.      No past medical history on file. No past surgical history on file. Social History   Socioeconomic History   Marital status: Single    Spouse name: Not on file   Number of children: Not on file   Years of education: Not on file   Highest education level: Not on file  Occupational History   Not on file  Tobacco Use   Smoking status: Never   Smokeless tobacco: Never  Substance and Sexual Activity   Alcohol use: Not on file   Drug use: Not on file   Sexual activity: Not on file  Other Topics Concern   Not on file  Social History Narrative   Not on file   Social Determinants of Health   Financial Resource Strain: Not on file  Food Insecurity: Not on file  Transportation Needs: Not on  file  Physical Activity: Not on file  Stress: Not on file  Social Connections: Not on file   No Known Allergies No family history on file.  Current Outpatient Medications (Endocrine & Metabolic):    predniSONE (DELTASONE) 20 MG tablet, Take 2 tablets (40 mg total) by mouth daily with breakfast.   predniSONE (DELTASONE) 50 MG tablet, Take 1 tablet (50 mg total) by mouth daily.  Current Outpatient Medications (Cardiovascular):    nitroGLYCERIN (NITRO-DUR) 0.2 mg/hr patch, Apply 1/4 of a patch to skin once daily.   propranolol (INDERAL) 10 MG tablet, Take 1-2 tablets (10-20 mg total) by mouth daily as needed (for tremor).  Current Outpatient Medications (Respiratory):    albuterol (VENTOLIN HFA) 108 (90 Base) MCG/ACT inhaler, Inhale 1-2 puffs into the lungs every 6 (six) hours as needed (for cough).  Current Outpatient Medications (Analgesics):    Ibuprofen-Famotidine 800-26.6 MG TABS, Take 1 tablet by mouth in the morning, at noon, and at bedtime.   Current Outpatient Medications (Other):    gabapentin (NEURONTIN) 300 MG capsule, Take 1 capsule (300 mg total) by mouth at bedtime.   Review of Systems:  No headache, visual changes, nausea, vomiting, diarrhea, constipation, dizziness, abdominal pain, skin rash, fevers, chills, night sweats, weight loss, swollen lymph nodes, body aches, joint swelling, chest pain, shortness of breath, mood changes. POSITIVE muscle aches  Objective  Blood pressure 126/90, pulse 73, height 6\' 3"  (1.905 m), weight 210 lb (95.3 kg), SpO2  99 %.   General: No apparent distress alert and oriented x3 mood and affect normal, dressed appropriately.  HEENT: Pupils equal, extraocular movements intact  Respiratory: Patient's speak in full sentences and does not appear short of breath  Cardiovascular: No lower extremity edema, non tender, no erythema  Gait normal with good balance and coordination.  MSK: Right elbow exam shows the patient does have tenderness to  palpation over the lateral epicondylar region.  Seems to be worse with resisted wrist extension.  Good grip strength noted.  No swelling over the elbow with full range of motion otherwise.  Limited muscular skeletal ultrasound was performed and interpreted by Antoine Primas, M Limited ultrasound shows the patient does have some very mild hypoechoic changes still at the insertion of the common extensor tendon.  Patient does have hyperechoic changes significant that is consistent with more of scar tissue formation.  Neovascularization in this area is noted. Impression: Interval improvement of the previous interstitial tearing with scar tissue formation.   Impression and Recommendations:     The above documentation has been reviewed and is accurate and complete Judi Saa, DO

## 2021-05-29 ENCOUNTER — Encounter: Payer: Self-pay | Admitting: Family Medicine

## 2021-05-29 ENCOUNTER — Other Ambulatory Visit: Payer: Self-pay

## 2021-05-29 ENCOUNTER — Ambulatory Visit: Payer: 59 | Admitting: Family Medicine

## 2021-05-29 ENCOUNTER — Ambulatory Visit: Payer: Self-pay

## 2021-05-29 VITALS — BP 126/90 | HR 73 | Ht 75.0 in | Wt 210.0 lb

## 2021-05-29 DIAGNOSIS — S56519A Strain of other extensor muscle, fascia and tendon at forearm level, unspecified arm, initial encounter: Secondary | ICD-10-CM | POA: Diagnosis not present

## 2021-05-29 NOTE — Assessment & Plan Note (Signed)
On ultrasound today even though patient continues to have the same symptoms I do notice some scar tissue formation.  We discussed the potential for either advanced imaging, possible PRP, or different medications.  At this point patient was to continue with the conservative therapy.  Patient can attempt to play tennis in 3 weeks and see how patient responds.  We discussed other ergonomic changes such as a vertical mouse that may be beneficial as well.  Follow-up again in 6 to 8 weeks.

## 2021-05-29 NOTE — Patient Instructions (Signed)
Scar tissue is healing Ok to try tennis in 3 weeks just take it easy Vertical muscle Thicker grip on tennis racket Tell parents hi See you again in 6 weeks

## 2021-07-10 ENCOUNTER — Ambulatory Visit: Payer: 59 | Admitting: Family Medicine

## 2021-07-23 NOTE — Progress Notes (Signed)
[place] Phone: [phone] Subjective:   [name], am serving as a scribe for Dr. Hulan Saas.This visit occurred during the SARS-CoV-2 public health emergency.  Safety protocols were in place, including screening questions prior to the visit, additional usage of staff PPE, and extensive cleaning of exam room while observing appropriate contact time as indicated for disinfecting solutions.  I'm seeing this patient by the request  of:  Pcp, No  CC: Right elbow pain  [id number]  05/29/2021 On ultrasound today even though patient continues to have the same symptoms I do notice some scar tissue formation.  We discussed the potential for either advanced imaging, possible PRP, or different medications.  At this point patient was to continue with the conservative therapy.  Patient can attempt to play tennis in 3 weeks and see how patient responds.  We discussed other ergonomic changes such as a vertical mouse that may be beneficial as well.  Follow-up again in 6 to 8 weeks.  Updated 07/24/2021 Brad Cuna is a 41 y.o. male coming in with complaint of R elbow pain. Patient states that he is doing better. Notes soreness after tennis and during the day.  Patient states that overall seems to be doing relatively well.       No past medical history on file. No past surgical history on file. Social History   Socioeconomic History   Marital status: Single    Spouse name: Not on file   Number of children: Not on file   Years of education: Not on file   Highest education level: Not on file  Occupational History   Not on file  Tobacco Use   Smoking status: Never   Smokeless tobacco: Never  Substance and Sexual Activity   Alcohol use: Not on file   Drug use: Not on file   Sexual activity: Not on file  Other Topics Concern   Not on file  Social History Narrative   Not on file   Social Determinants of  Health   Financial Resource Strain: Not on file  Food Insecurity: Not on file  Transportation Needs: Not on file  Physical Activity: Not on file  Stress: Not on file  Social Connections: Not on file   No Known Allergies No family history on file.  Current Outpatient Medications (Endocrine & Metabolic):    predniSONE (DELTASONE) 20 MG tablet, Take 2 tablets (40 mg total) by mouth daily with breakfast.   predniSONE (DELTASONE) 50 MG tablet, Take 1 tablet (50 mg total) by mouth daily.  Current Outpatient Medications (Cardiovascular):    nitroGLYCERIN (NITRO-DUR) 0.2 mg/hr patch, Apply 1/4 of a patch to skin once daily.   propranolol (INDERAL) 10 MG tablet, Take 1-2 tablets (10-20 mg total) by mouth daily as needed (for tremor).  Current Outpatient Medications (Respiratory):    albuterol (VENTOLIN HFA) 108 (90 Base) MCG/ACT inhaler, Inhale 1-2 puffs into the lungs every 6 (six) hours as needed (for cough).  Current Outpatient Medications (Analgesics):    Ibuprofen-Famotidine 800-26.6 MG TABS, Take 1 tablet by mouth in the morning, at noon, and at bedtime.   Current Outpatient Medications (Other):    gabapentin (NEURONTIN) 300 MG capsule, Take 1 capsule (300 mg total) by mouth at bedtime.   Objective  Blood pressure 140/82, pulse 85, height 6\' 3"  (1.905 m), weight 217 lb (98.4 kg), SpO2 97 %.   General: No apparent distress alert and oriented x3 mood and  affect normal, dressed appropriately.  HEENT: Pupils equal, extraocular movements intact  Respiratory: Patient's speak in full sentences and does not appear short of breath  Cardiovascular: No lower extremity edema, non tender, no erythema  Gait normal with good balance and coordination.  MSK: Right elbow exam does have some tenderness to palpation but no pain with resistance of extension of the wrist.  Limited muscular skeletal ultrasound was performed and interpreted by Hulan Saas, M  Limited ultrasound noted on the right  side elbow shows the patient still has some hypoechoic changes noted within the tendon sheath.  Patient also has some mild increasing neovascularization.  Does seem to be improvement from previous exam.  No significant avulsion noted. Impression: Interval improvement   Impression and Recommendations:    The above documentation has been reviewed and is accurate and complete Lyndal Pulley, DO

## 2021-07-24 ENCOUNTER — Ambulatory Visit: Payer: 59 | Admitting: Family Medicine

## 2021-07-24 ENCOUNTER — Ambulatory Visit: Payer: Self-pay

## 2021-07-24 ENCOUNTER — Other Ambulatory Visit: Payer: Self-pay

## 2021-07-24 ENCOUNTER — Encounter: Payer: Self-pay | Admitting: Family Medicine

## 2021-07-24 VITALS — BP 140/82 | HR 85 | Ht 75.0 in | Wt 217.0 lb

## 2021-07-24 DIAGNOSIS — M25521 Pain in right elbow: Secondary | ICD-10-CM

## 2021-07-24 DIAGNOSIS — S56519A Strain of other extensor muscle, fascia and tendon at forearm level, unspecified arm, initial encounter: Secondary | ICD-10-CM | POA: Diagnosis not present

## 2021-07-24 NOTE — Assessment & Plan Note (Signed)
Still very mild hypoechoic changes but nothing severe at this time.  No changes in management follow-up as needed if doing relatively well.

## 2021-07-24 NOTE — Patient Instructions (Signed)
Good to see you   

## 2021-10-30 DIAGNOSIS — L02222 Furuncle of back [any part, except buttock]: Secondary | ICD-10-CM | POA: Diagnosis not present

## 2021-12-23 IMAGING — DX DG LUMBAR SPINE 2-3V
3 series · 3 of 3 positions shown · non-contrast
Comparison: None.

CLINICAL DATA: Lumbar spine pain. Patient reports low back pain for
4 days. No known injury.

EXAM:
LUMBAR SPINE - 2-3 VIEW

[l-spine ap]
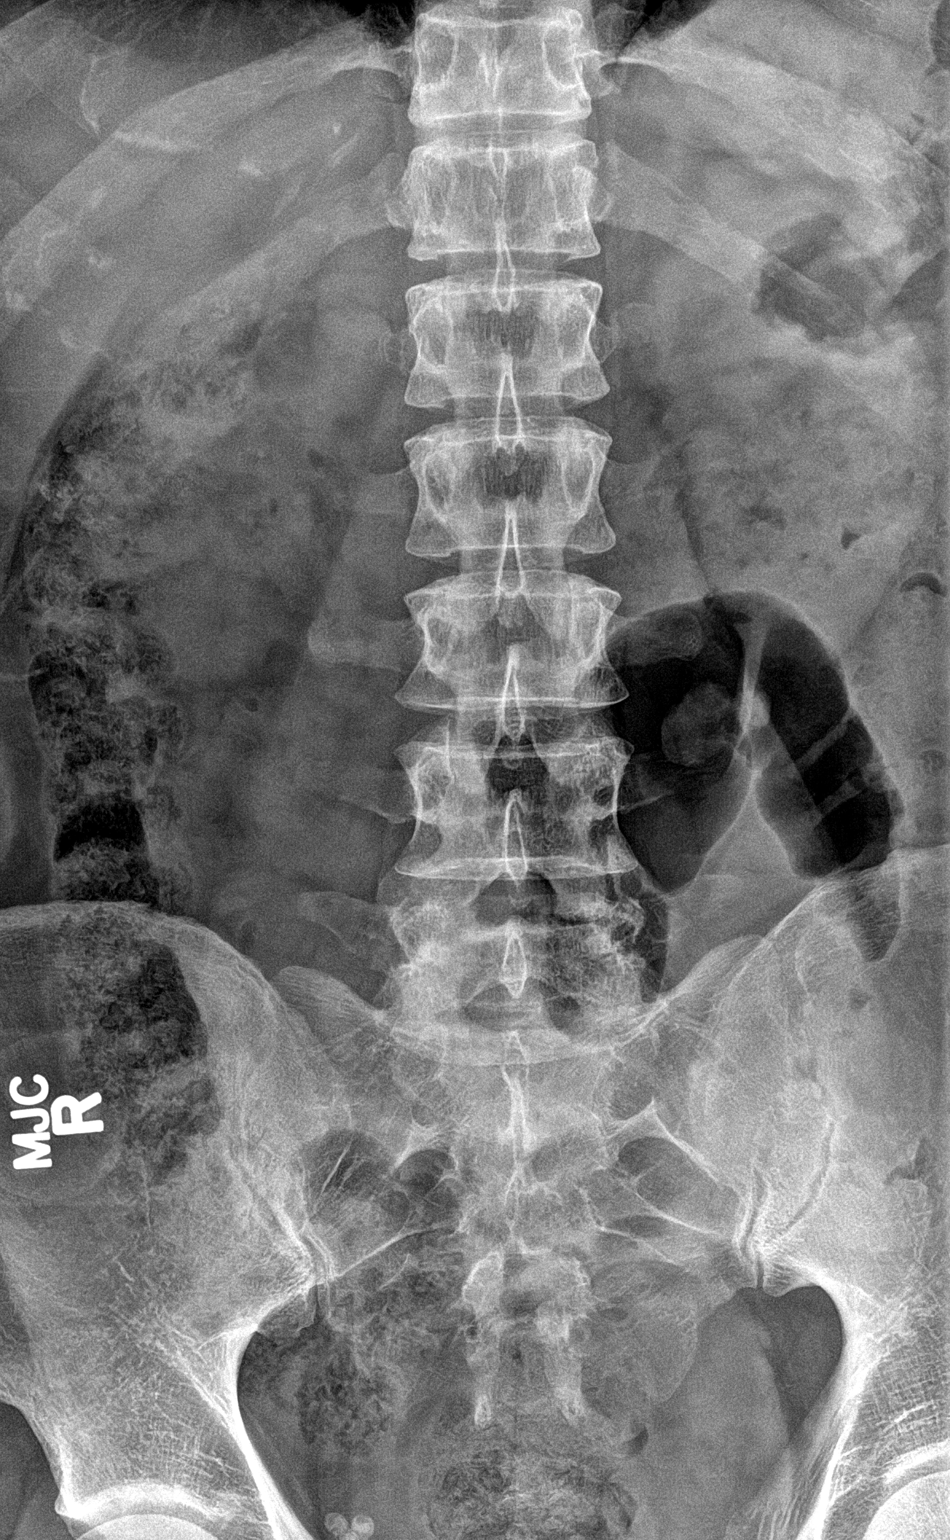

[l-spine lateral]
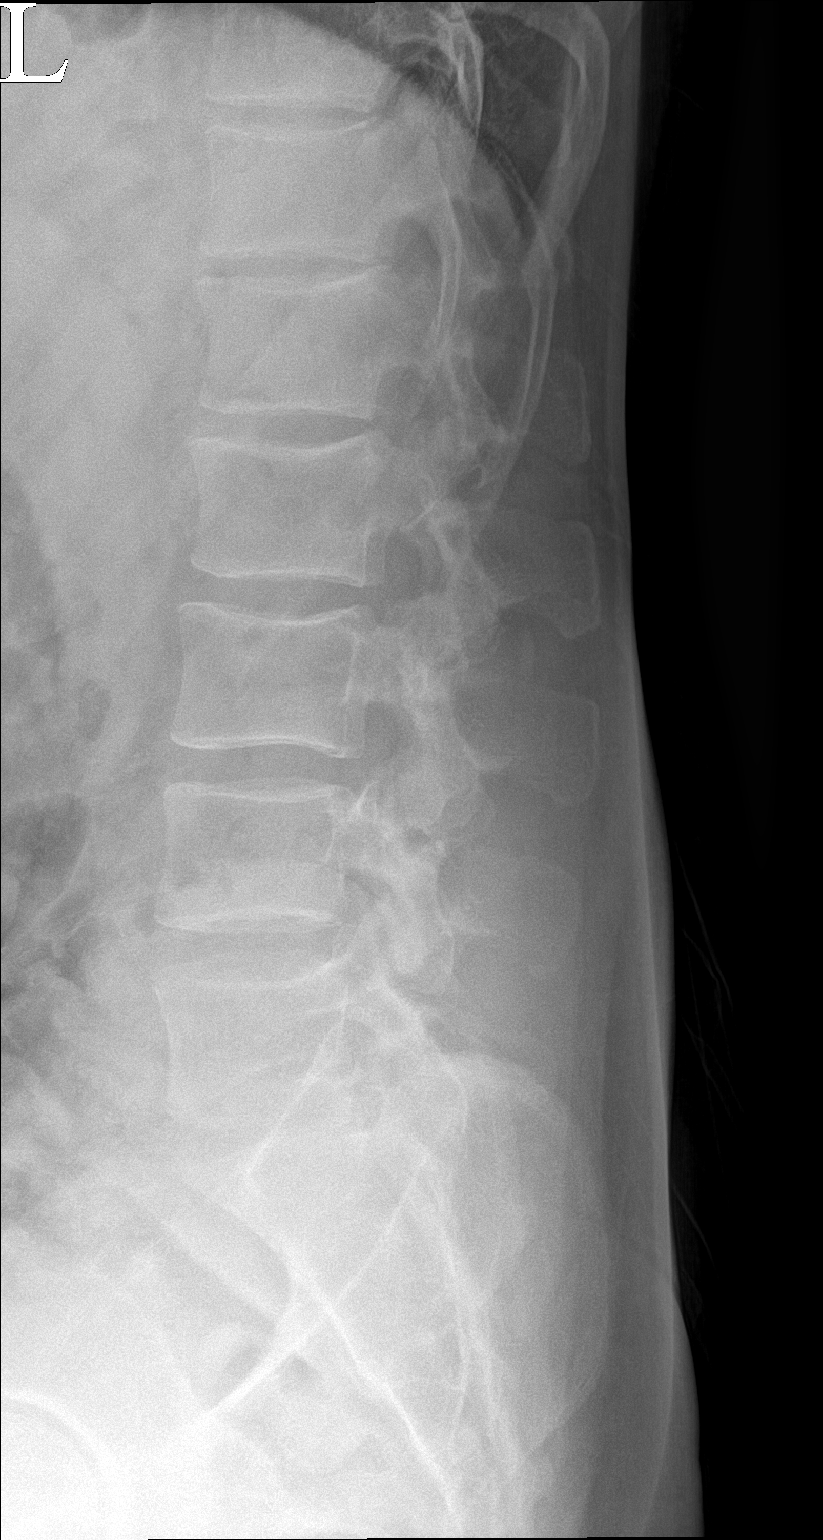

[l-spine spot]
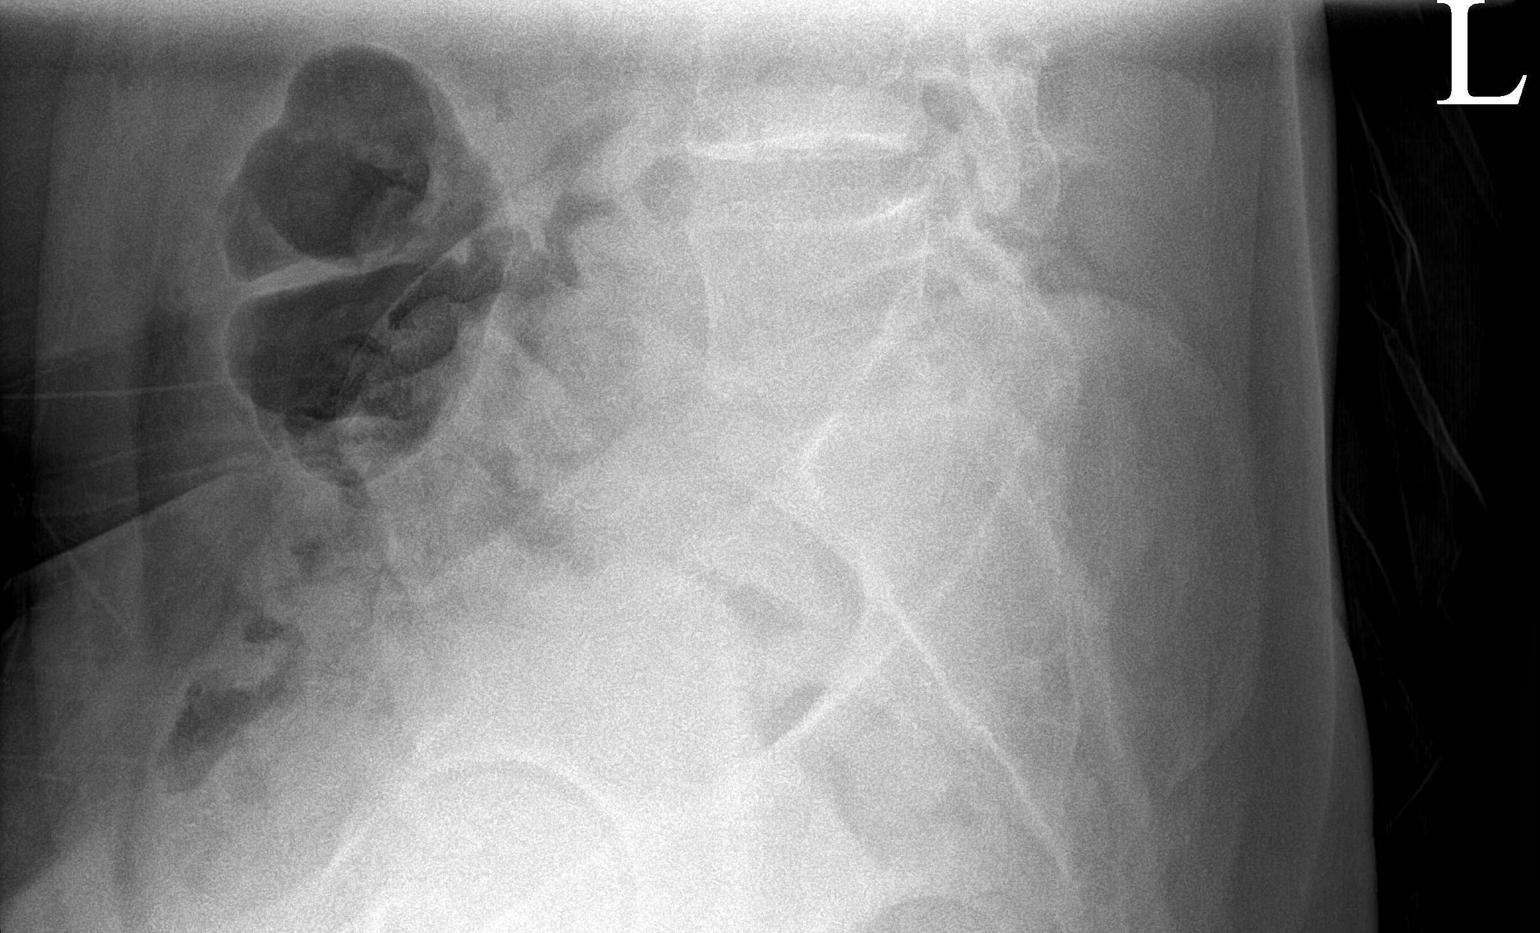

[3 of 3 positions shown; findings below may reference images not displayed]

FINDINGS: There are 5 non-rib-bearing lumbar vertebra. Straightening of normal
lordosis, alignment is otherwise maintained. Vertebral body heights
are normal. There is no listhesis. The posterior elements are
intact. Disc spaces are preserved. No fracture, pars defects or
focal bone abnormality. Sacroiliac joints are symmetric and normal.
IMPRESSION: Straightening of normal lordosis, can be seen with muscle spasm.
Otherwise negative radiographs of the lumbar spine.

## 2021-12-25 DIAGNOSIS — Z0100 Encounter for examination of eyes and vision without abnormal findings: Secondary | ICD-10-CM | POA: Diagnosis not present

## 2022-01-01 DIAGNOSIS — L858 Other specified epidermal thickening: Secondary | ICD-10-CM | POA: Diagnosis not present

## 2022-01-01 DIAGNOSIS — L02222 Furuncle of back [any part, except buttock]: Secondary | ICD-10-CM | POA: Diagnosis not present

## 2022-01-15 ENCOUNTER — Encounter (HOSPITAL_BASED_OUTPATIENT_CLINIC_OR_DEPARTMENT_OTHER): Payer: Self-pay | Admitting: Family Medicine

## 2022-01-15 ENCOUNTER — Ambulatory Visit (INDEPENDENT_AMBULATORY_CARE_PROVIDER_SITE_OTHER): Payer: 59 | Admitting: Family Medicine

## 2022-01-15 VITALS — BP 139/74 | HR 65 | Ht 75.0 in | Wt 211.6 lb

## 2022-01-15 DIAGNOSIS — Z23 Encounter for immunization: Secondary | ICD-10-CM

## 2022-01-15 DIAGNOSIS — Z0000 Encounter for general adult medical examination without abnormal findings: Secondary | ICD-10-CM | POA: Diagnosis not present

## 2022-01-15 NOTE — Patient Instructions (Signed)
  Medication Instructions:  Your physician recommends that you continue on your current medications as directed. Please refer to the Current Medication list given to you today. --If you need a refill on any your medications before your next appointment, please call your pharmacy first. If no refills are authorized on file call the office.-- Lab Work: Your physician has recommended that you have lab work today: No If you have labs (blood work) drawn today and your tests are completely normal, you will receive your results via MyChart message OR a phone call from our staff.  Please ensure you check your voicemail in the event that you authorized detailed messages to be left on a delegated number. If you have any lab test that is abnormal or we need to change your treatment, we will call you to review the results.  Referrals/Procedures/Imaging: No  Follow-Up: Your next appointment:   Your physician recommends that you schedule a follow-up appointment in: 1 year cpe with Dr. de Cuba.  You will receive a text message or e-mail with a link to a survey about your care and experience with us today! We would greatly appreciate your feedback!   Thanks for letting us be apart of your health journey!!  Primary Care and Sports Medicine   Dr. Raymond de Cuba   We encourage you to activate your patient portal called "MyChart".  Sign up information is provided on this After Visit Summary.  MyChart is used to connect with patients for Virtual Visits (Telemedicine).  Patients are able to view lab/test results, encounter notes, upcoming appointments, etc.  Non-urgent messages can be sent to your provider as well. To learn more about what you can do with MyChart, please visit --  [email or web address].    

## 2022-01-15 NOTE — Progress Notes (Signed)
New Patient Office Visit  Subjective    Patient ID: Derrick Williams, male    DOB: October 09, 1980  Age: 41 y.o. MRN: [id number]  CC:  Chief Complaint  Patient presents with   New Patient (Initial Visit)    Pt here to establish new care    HPI Derrick Williams presents to establish care Last PCP - none recently.  Had been doing physicals through [facility]. Needing to have physical done for this year. Denies any chronic medical issues, no regular medications. No surgeries in the past. Maternal aunt and Paternal uncle with cancer. Maternal uncle with leukemia. Parents with HTN, HLD. No Fhx of DM. Last dental visit was about 6 months ago he is due for tetanus  Patient is originally from [place], born in [place]. 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 Outside of work, he enjoys playing tennis, spending time with family.  Outpatient Encounter Medications as of 01/15/2022  Medication Sig   [DISCONTINUED] albuterol (VENTOLIN HFA) 108 (90 Base) MCG/ACT inhaler Inhale 1-2 puffs into the lungs every 6 (six) hours as needed (for cough).   [DISCONTINUED] gabapentin (NEURONTIN) 300 MG capsule Take 1 capsule (300 mg total) by mouth at bedtime.   [DISCONTINUED] Ibuprofen-Famotidine 800-26.6 MG TABS Take 1 tablet by mouth in the morning, at noon, and at bedtime.   [DISCONTINUED] nitroGLYCERIN (NITRO-DUR) 0.2 mg/hr patch Apply 1/4 of a patch to skin once daily.   [DISCONTINUED] predniSONE (DELTASONE) 20 MG tablet Take 2 tablets (40 mg total) by mouth daily with breakfast.   [DISCONTINUED] predniSONE (DELTASONE) 50 MG tablet Take 1 tablet (50 mg total) by mouth daily.   [DISCONTINUED] propranolol (INDERAL) 10 MG tablet Take 1-2 tablets (10-20 mg total) by mouth daily as needed (for tremor).   No facility-administered encounter medications on file as of 01/15/2022.    History reviewed. No pertinent past medical history.  History reviewed. No pertinent surgical history.  History reviewed.  No pertinent family history.  Social History   Socioeconomic History   Marital status: Single    Spouse name: Not on file   Number of children: Not on file   Years of education: Not on file   Highest education level: Not on file  Occupational History   Not on file  Tobacco Use   Smoking status: Never   Smokeless tobacco: Never  Substance and Sexual Activity   Alcohol use: Not on file   Drug use: Not on file   Sexual activity: Not on file  Other Topics Concern   Not on file  Social History Narrative   Not on file   Social Determinants of Health   Financial Resource Strain: Not on file  Food Insecurity: Not on file  Transportation Needs: Not on file  Physical Activity: Not on file  Stress: Not on file  Social Connections: Not on file  Intimate Partner Violence: Not on file    Objective    BP 139/74   Pulse 65   Ht 6\' 3"  (1.905 m)   Wt 211 lb 9.6 oz (96 kg)   SpO2 100%   BMI 26.45 kg/m   Physical Exam Constitutional:      General: He is not in acute distress.    Appearance: He is normal weight.  HENT:     Head: Normocephalic and atraumatic.     Right Ear: Tympanic membrane, ear canal and external ear normal.     Left Ear: Tympanic membrane, ear canal and external ear normal.  Nose: Nose normal.     Mouth/Throat:     Mouth: Mucous membranes are moist.     Pharynx: Oropharynx is clear.  Eyes:     Extraocular Movements: Extraocular movements intact.     Conjunctiva/sclera: Conjunctivae normal.     Pupils: Pupils are equal, round, and reactive to light.  Cardiovascular:     Rate and Rhythm: Normal rate and regular rhythm.     Pulses: Normal pulses.     Heart sounds: Normal heart sounds. No murmur heard. Pulmonary:     Effort: Pulmonary effort is normal.     Breath sounds: Normal breath sounds. No wheezing.  Abdominal:     General: Bowel sounds are normal. There is no distension.     Palpations: Abdomen is soft.     Tenderness: There is no abdominal  tenderness. There is no guarding.  Musculoskeletal:     Cervical back: Normal range of motion. No tenderness.  Skin:    General: Skin is warm.     Capillary Refill: Capillary refill takes less than 2 seconds.     Coloration: Skin is not jaundiced.     Findings: No rash.  Neurological:     General: No focal deficit present.     Mental Status: He is alert and oriented to person, place, and time.     Gait: Gait normal.  Psychiatric:        Mood and Affect: Mood normal.        Behavior: Behavior normal.     Assessment & Plan:   Problem List Items Addressed This Visit       Other   Wellness examination    Routine HCM labs reviewed. HCM reviewed/discussed. Anticipatory guidance regarding healthy weight, lifestyle and choices given. Recommend healthy diet.  Recommend approximately 150 minutes/week of moderate intensity exercise Recommend regular dental and vision exams Always use seatbelt/lap and shoulder restraints Recommend using smoke alarms and checking batteries at least twice a year Recommend using sunscreen when outside Discussed tetanus immunization recommendations, patient agreed to proceed with this today       Return in about 1 year (around 01/16/2023) for CPE.   Troy Kanouse J De Peru, MD

## 2022-01-15 NOTE — Assessment & Plan Note (Signed)
Routine HCM labs reviewed. HCM reviewed/discussed. Anticipatory guidance regarding healthy weight, lifestyle and choices given. Recommend healthy diet.  Recommend approximately 150 minutes/week of moderate intensity exercise Recommend regular dental and vision exams Always use seatbelt/lap and shoulder restraints Recommend using smoke alarms and checking batteries at least twice a year Recommend using sunscreen when outside Discussed tetanus immunization recommendations, patient agreed to proceed with this today 

## 2023-02-18 ENCOUNTER — Ambulatory Visit (INDEPENDENT_AMBULATORY_CARE_PROVIDER_SITE_OTHER): Payer: Commercial Managed Care - PPO | Admitting: Family Medicine

## 2023-02-18 ENCOUNTER — Ambulatory Visit: Payer: Self-pay

## 2023-02-18 ENCOUNTER — Ambulatory Visit (INDEPENDENT_AMBULATORY_CARE_PROVIDER_SITE_OTHER): Payer: Commercial Managed Care - PPO

## 2023-02-18 ENCOUNTER — Encounter: Payer: Self-pay | Admitting: Family Medicine

## 2023-02-18 VITALS — BP 124/84 | HR 72 | Ht 75.0 in | Wt 202.0 lb

## 2023-02-18 DIAGNOSIS — G8929 Other chronic pain: Secondary | ICD-10-CM | POA: Diagnosis not present

## 2023-02-18 DIAGNOSIS — M25511 Pain in right shoulder: Secondary | ICD-10-CM

## 2023-02-18 MED ORDER — VITAMIN D (ERGOCALCIFEROL) 1.25 MG (50000 UNIT) PO CAPS: 50000.0000 [IU] | ORAL_CAPSULE | ORAL | 0 refills | Status: DC

## 2023-02-18 MED ORDER — MELOXICAM 15 MG PO TABS: 15.0000 mg | ORAL_TABLET | Freq: Every day | ORAL | 0 refills | Status: DC

## 2023-02-18 NOTE — Progress Notes (Signed)
**Note Derrick-Identified via Obfuscation** Derrick Williams [address] [place] [address] Phone: [phone] Subjective:   Derrick Williams, am serving as a scribe for Dr. Antoine Williams.  I'm seeing this patient by the request  of:  Derrick Peru, Buren Kos, MD  CC: Right shoulder pain follow-up  [id number]  Derrick Williams is a 42 y.o. male coming in with complaint of R shoulder pain. 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 Flexion and ER is painful. Pain in middle deltoid. Denies any radiating symptoms.      No past medical history on file. No past surgical history on file. Social History   Socioeconomic History   Marital status: Single    Spouse name: Not on file   Number of children: Not on file   Years of education: Not on file   Highest education level: Not on file  Occupational History   Not on file  Tobacco Use   Smoking status: Never   Smokeless tobacco: Never  Substance and Sexual Activity   Alcohol use: Not on file   Drug use: Not on file   Sexual activity: Not on file  Other Topics Concern   Not on file  Social History Narrative   Not on file   Social Determinants of Health   Financial Resource Strain: Not on file  Food Insecurity: Not on file  Transportation Needs: Not on file  Physical Activity: Not on file  Stress: Not on file  Social Connections: Not on file   No Known Allergies No family history on file.     Current Outpatient Medications (Analgesics):    meloxicam (MOBIC) 15 MG tablet, Take 1 tablet (15 mg total) by mouth daily.   Current Outpatient Medications (Other):    Vitamin D, Ergocalciferol, (DRISDOL) 1.25 MG (50000 UNIT) CAPS capsule, Take 1 capsule (50,000 Units total) by mouth every 7 (seven) days.   Reviewed prior external information including notes and imaging from  primary care provider As well as notes that were available from care everywhere and other healthcare systems.  Past medical history, social,  surgical and family history all reviewed in electronic medical record.  No pertanent information unless stated regarding to the chief complaint.   Review of Systems:  No headache, visual changes, nausea, vomiting, diarrhea, constipation, dizziness, abdominal pain, skin rash, fevers, chills, night sweats, weight loss, swollen lymph nodes, body aches, joint swelling, chest pain, shortness of breath, mood changes. POSITIVE muscle aches  Objective  Blood pressure 124/84, pulse 72, height 6\' 3"  (1.905 m), weight 202 lb (91.6 kg), SpO2 98%.   General: No apparent distress alert and oriented x3 mood and affect normal, dressed appropriately.  HEENT: Pupils equal, extraocular movements intact  Respiratory: Patient's speak in full sentences and does not appear short of breath  Cardiovascular: No lower extremity edema, non tender, no erythema  Right shoulder exam shows patient does have a fairly good range of motion but with internal range of motion patient is a severe amount of pain. Humerus does have positive fulcrum test of the humerus.  Rotator cuff strength does appear to be intact.  Does have some voluntary guarding noted.   Limited muscular skeletal ultrasound was performed and interpreted by Derrick Williams, M  Limited ultrasound shows patient does have what appears to be a cortical irregularity noted at the anatomical neck on the lateral aspect of the humerus.  Patient does not have any true rotator cuff tear appreciated.  Acromioclavicular joint is unremarkable.  Bicep tendon is unremarkable. Impression: Irregularity noted of the lateral aspect of the humerus that is consistent with potential stress reaction or stress fracture   Impression and Recommendations:

## 2023-02-18 NOTE — Assessment & Plan Note (Signed)
Right shoulder pain.  Discussed with patient about icing regimen and home exercises.  Discussed with patient that there is a possible cortical irregularity noted at the anatomical neck of the humerus on the lateral aspect.  X-rays do not show any true stress fracture.  At this point though due to the severity of pain and no other findings on ultrasound I think we do need to treat as a stress reaction.  Vitamin D, meloxicam, icing regimen and home exercises given.  Patient will increase activity slowly over the course of the next several days.  Follow-up with me again in 4 to 5 weeks otherwise.

## 2023-02-18 NOTE — Patient Instructions (Signed)
Meloxicam and Vit D called in Exercises No tennis for 2 weeks See me in 5 weeks

## 2023-02-25 ENCOUNTER — Ambulatory Visit (HOSPITAL_BASED_OUTPATIENT_CLINIC_OR_DEPARTMENT_OTHER): Payer: Commercial Managed Care - PPO | Admitting: Family Medicine

## 2023-03-24 NOTE — Progress Notes (Deleted)
  [facility] [address] [place] [address] Phone: [phone] Subjective:    I'm seeing this patient by the request  of:  de [place], Buren Kos, MD  CC:   [id number]  02/18/2023 Right shoulder pain.  Discussed with patient about icing regimen and home exercises.  Discussed with patient that there is a possible cortical irregularity noted at the anatomical neck of the humerus on the lateral aspect.  X-rays do not show any true stress fracture.  At this point though due to the severity of pain and no other findings on ultrasound I think we do need to treat as a stress reaction.  Vitamin D, meloxicam, icing regimen and home exercises given.  Patient will increase activity slowly over the course of the next several days.  Follow-up with me again in 4 to 5 weeks otherwise.     Updated 03/25/2023 Derrick Williams is a 42 y.o. male coming in with complaint of R shoulder pain      No past medical history on file. No past surgical history on file. Social History   Socioeconomic History   Marital status: Single    Spouse name: Not on file   Number of children: Not on file   Years of education: Not on file   Highest education level: Not on file  Occupational History   Not on file  Tobacco Use   Smoking status: Never   Smokeless tobacco: Never  Substance and Sexual Activity   Alcohol use: Not on file   Drug use: Not on file   Sexual activity: Not on file  Other Topics Concern   Not on file  Social History Narrative   Not on file   Social Determinants of Health   Financial Resource Strain: Not on file  Food Insecurity: Not on file  Transportation Needs: Not on file  Physical Activity: Not on file  Stress: Not on file  Social Connections: Not on file   No Known Allergies No family history on file.     Current Outpatient Medications (Analgesics):    meloxicam (MOBIC) 15 MG tablet, Take 1 tablet (15 mg total) by mouth  daily.   Current Outpatient Medications (Other):    Vitamin D, Ergocalciferol, (DRISDOL) 1.25 MG (50000 UNIT) CAPS capsule, Take 1 capsule (50,000 Units total) by mouth every 7 (seven) days.   Reviewed prior external information including notes and imaging from  primary care provider As well as notes that were available from care everywhere and other healthcare systems.  Past medical history, social, surgical and family history all reviewed in electronic medical record.  No pertanent information unless stated regarding to the chief complaint.   Review of Systems:  No headache, visual changes, nausea, vomiting, diarrhea, constipation, dizziness, abdominal pain, skin rash, fevers, chills, night sweats, weight loss, swollen lymph nodes, body aches, joint swelling, chest pain, shortness of breath, mood changes. POSITIVE muscle aches  Objective  There were no vitals taken for this visit.   General: No apparent distress alert and oriented x3 mood and affect normal, dressed appropriately.  HEENT: Pupils equal, extraocular movements intact  Respiratory: Patient's speak in full sentences and does not appear short of breath  Cardiovascular: No lower extremity edema, non tender, no erythema      Impression and Recommendations:

## 2023-03-25 ENCOUNTER — Ambulatory Visit: Payer: Commercial Managed Care - PPO | Admitting: Family Medicine

## 2023-09-27 LAB — LAB REPORT - SCANNED
A1c: 5.4
EGFR: 112
TSH: 1.13 (ref 0.41–5.90)

## 2023-12-23 ENCOUNTER — Ambulatory Visit (INDEPENDENT_AMBULATORY_CARE_PROVIDER_SITE_OTHER): Admitting: Family Medicine

## 2023-12-23 ENCOUNTER — Encounter (HOSPITAL_BASED_OUTPATIENT_CLINIC_OR_DEPARTMENT_OTHER): Payer: Self-pay | Admitting: Family Medicine

## 2023-12-23 VITALS — BP 139/80 | HR 91 | Ht 75.0 in | Wt 199.8 lb

## 2023-12-23 DIAGNOSIS — Z0000 Encounter for general adult medical examination without abnormal findings: Secondary | ICD-10-CM | POA: Diagnosis not present

## 2023-12-23 NOTE — Progress Notes (Signed)
 Subjective:    CC: Annual Physical Exam  HPI:  Derrick Williams is a 43 y.o. presenting for annual physical  I reviewed the past medical history, family history, social history, surgical history, and allergies today and no changes were needed.  Please see the problem list section below in epic for further details.  Past Medical History: Past Medical History:  Diagnosis Date   Arthritis 2022   Neuromuscular disorder (HCC) 2021   Past Surgical History: Past Surgical History:  Procedure Laterality Date   EYE SURGERY  2019   Lasik   Social History: Social History   Socioeconomic History   Marital status: Single    Spouse name: Not on file   Number of children: Not on file   Years of education: Not on file   Highest education level: Professional school degree (e.g., MD, DDS, DVM, JD)  Occupational History   Not on file  Tobacco Use   Smoking status: Never    Passive exposure: Never   Smokeless tobacco: Never  Vaping Use   Vaping status: Never Used  Substance and Sexual Activity   Alcohol use: Yes   Drug use: Never   Sexual activity: Not on file  Other Topics Concern   Not on file  Social History Narrative   Not on file   Social Drivers of Health   Financial Resource Strain: Low Risk  (12/22/2023)   Overall Financial Resource Strain (CARDIA)    Difficulty of Paying Living Expenses: Not hard at all  Food Insecurity: No Food Insecurity (12/22/2023)   Hunger Vital Sign    Worried About Running Out of Food in the Last Year: Never true    Ran Out of Food in the Last Year: Never true  Transportation Needs: No Transportation Needs (12/22/2023)   PRAPARE - Administrator, Civil Service (Medical): No    Lack of Transportation (Non-Medical): No  Physical Activity: Sufficiently Active (12/22/2023)   Exercise Vital Sign    Days of Exercise per Week: 1 day    Minutes of Exercise per Session: 150+ min  Stress: Stress Concern Present (12/22/2023)   [employer] of  Occupational Health - Occupational Stress Questionnaire    Feeling of Stress: To some extent  Social Connections: Moderately Integrated (12/22/2023)   Social Connection and Isolation Panel    Frequency of Communication with Friends and Family: More than three times a week    Frequency of Social Gatherings with Friends and Family: Once a week    Attends Religious Services: More than 4 times per year    Active Member of [employer] or Organizations: Yes    Attends Engineer, structural: Not on file    Marital Status: Never married   Family History: Family History  Problem Relation Age of Onset   Arthritis Mother    Heart disease Mother    Hypertension Mother    Hypertension Father    Cancer Maternal Uncle    Cancer Maternal Uncle    Cancer Paternal Aunt    Cancer Paternal Uncle    Cancer Paternal Aunt    Allergies: No Known Allergies Medications: See med rec.  Review of Systems: No headache, visual changes, nausea, vomiting, diarrhea, constipation, dizziness, abdominal pain, skin rash, fevers, chills, night sweats, swollen lymph nodes, weight loss, chest pain, body aches, joint swelling, muscle aches, shortness of breath, mood changes, visual or auditory hallucinations.  Objective:    BP (!) 145/85 (BP Location: Left Arm, Patient Position: Sitting, Cuff Size:  Normal)   Pulse 91   Ht 6' 3 (1.905 m)   Wt 199 lb 12.8 oz (90.6 kg)   SpO2 98%   BMI 24.97 kg/m   General: Well Developed, well nourished, and in no acute distress. Neuro: Alert and oriented x3, extra-ocular muscles intact, sensation grossly intact. Cranial nerves II through XII are intact, motor, sensory, and coordinative functions are all intact. HEENT: Normocephalic, atraumatic, pupils equal round reactive to light, neck supple, no masses, no lymphadenopathy, thyroid nonpalpable. Oropharynx, nasopharynx, external ear canals are unremarkable. Skin: Warm and dry, no rashes noted. Cardiac: Regular rate and rhythm, no  murmurs rubs or gallops. Respiratory: Clear to auscultation bilaterally. Not using accessory muscles, speaking in full sentences. Abdominal: Soft, nontender, nondistended, positive bowel sounds, no masses, no organomegaly. Musculoskeletal: Shoulder, elbow, wrist, hip, knee, ankle stable, and with full range of motion.  Impression and Recommendations:    Wellness examination Assessment & Plan: Routine HCM labs reviewed - completed through work. HCM reviewed/discussed. Anticipatory guidance regarding healthy weight, lifestyle and choices given. Recommend healthy diet.  Recommend approximately 150 minutes/week of moderate intensity exercise Recommend regular dental and vision exams Always use seatbelt/lap and shoulder restraints Recommend using smoke alarms and checking batteries at least twice a year Recommend using sunscreen when outside Discussed immunization recommendations   Return in about 1 year (around 12/22/2024) for CPE.   ___________________________________________ Derrick Stelmach de [place], MD, ABFM, CAQSM Primary Care and Sports Medicine [facility]

## 2023-12-23 NOTE — Patient Instructions (Signed)
   Medication Instructions:  Your physician recommends that you continue on your current medications as directed. Please refer to the Current Medication list given to you today. --If you need a refill on any your medications before your next appointment, please call your pharmacy first. If no refills are authorized on file call the office.--   Follow-Up: Your next appointment:   Your physician recommends that you schedule a follow-up appointment in: 1 year physical with Dr. de [place]  You will receive a text message or e-mail with a link to a survey about your care and experience with [place] today! We would greatly appreciate your feedback!   Thanks for letting us be apart of your health journey!!  Primary Care and Sports Medicine   Dr. Ceasar Mons [place]   We encourage you to activate your patient portal called "MyChart".  Sign up information is provided on this After Visit Summary.  MyChart is used to connect with patients for Virtual Visits (Telemedicine).  Patients are able to view lab/test results, encounter notes, upcoming appointments, etc.  Non-urgent messages can be sent to your provider as well. To learn more about what you can do with MyChart, please visit --  [email or web address].

## 2023-12-23 NOTE — Assessment & Plan Note (Signed)
 Routine HCM labs reviewed - completed through work. HCM reviewed/discussed. Anticipatory guidance regarding healthy weight, lifestyle and choices given. Recommend healthy diet.  Recommend approximately 150 minutes/week of moderate intensity exercise Recommend regular dental and vision exams Always use seatbelt/lap and shoulder restraints Recommend using smoke alarms and checking batteries at least twice a year Recommend using sunscreen when outside Discussed immunization recommendations

## 2024-01-20 ENCOUNTER — Encounter (HOSPITAL_BASED_OUTPATIENT_CLINIC_OR_DEPARTMENT_OTHER): Admitting: Family Medicine
